# Patient Record
Sex: Male | Born: 2000 | Race: Black or African American | Hispanic: No | Marital: Single | Smoking: Never smoker
Health system: Southern US, Community
[De-identification: ages and names within clinical notes are randomized; demographics above are authoritative.]

## PROBLEM LIST (undated history)

## (undated) ENCOUNTER — Ambulatory Visit (HOSPITAL_COMMUNITY): Admission: EM | Payer: Medicaid Other | Source: Home / Self Care

## (undated) DIAGNOSIS — L409 Psoriasis, unspecified: Secondary | ICD-10-CM

## (undated) HISTORY — PX: WISDOM TOOTH EXTRACTION: SHX21

## (undated) HISTORY — PX: KNEE ARTHROSCOPY W/ ACL RECONSTRUCTION: SHX1858

---
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
## 2018-02-05 ENCOUNTER — Ambulatory Visit: Payer: Self-pay | Admitting: Primary Care

## 2018-12-07 IMAGING — CR DG CHEST 2V
2 series · 2 of 2 positions shown · non-contrast
Comparison: 04/05/2015

CLINICAL DATA: Left-sided chest tightness for 4 days. Recently
finished a course of antibiotics for bronchitis. Continued chest
tightness.

EXAM:
CHEST  2 VIEW

[chest pa]
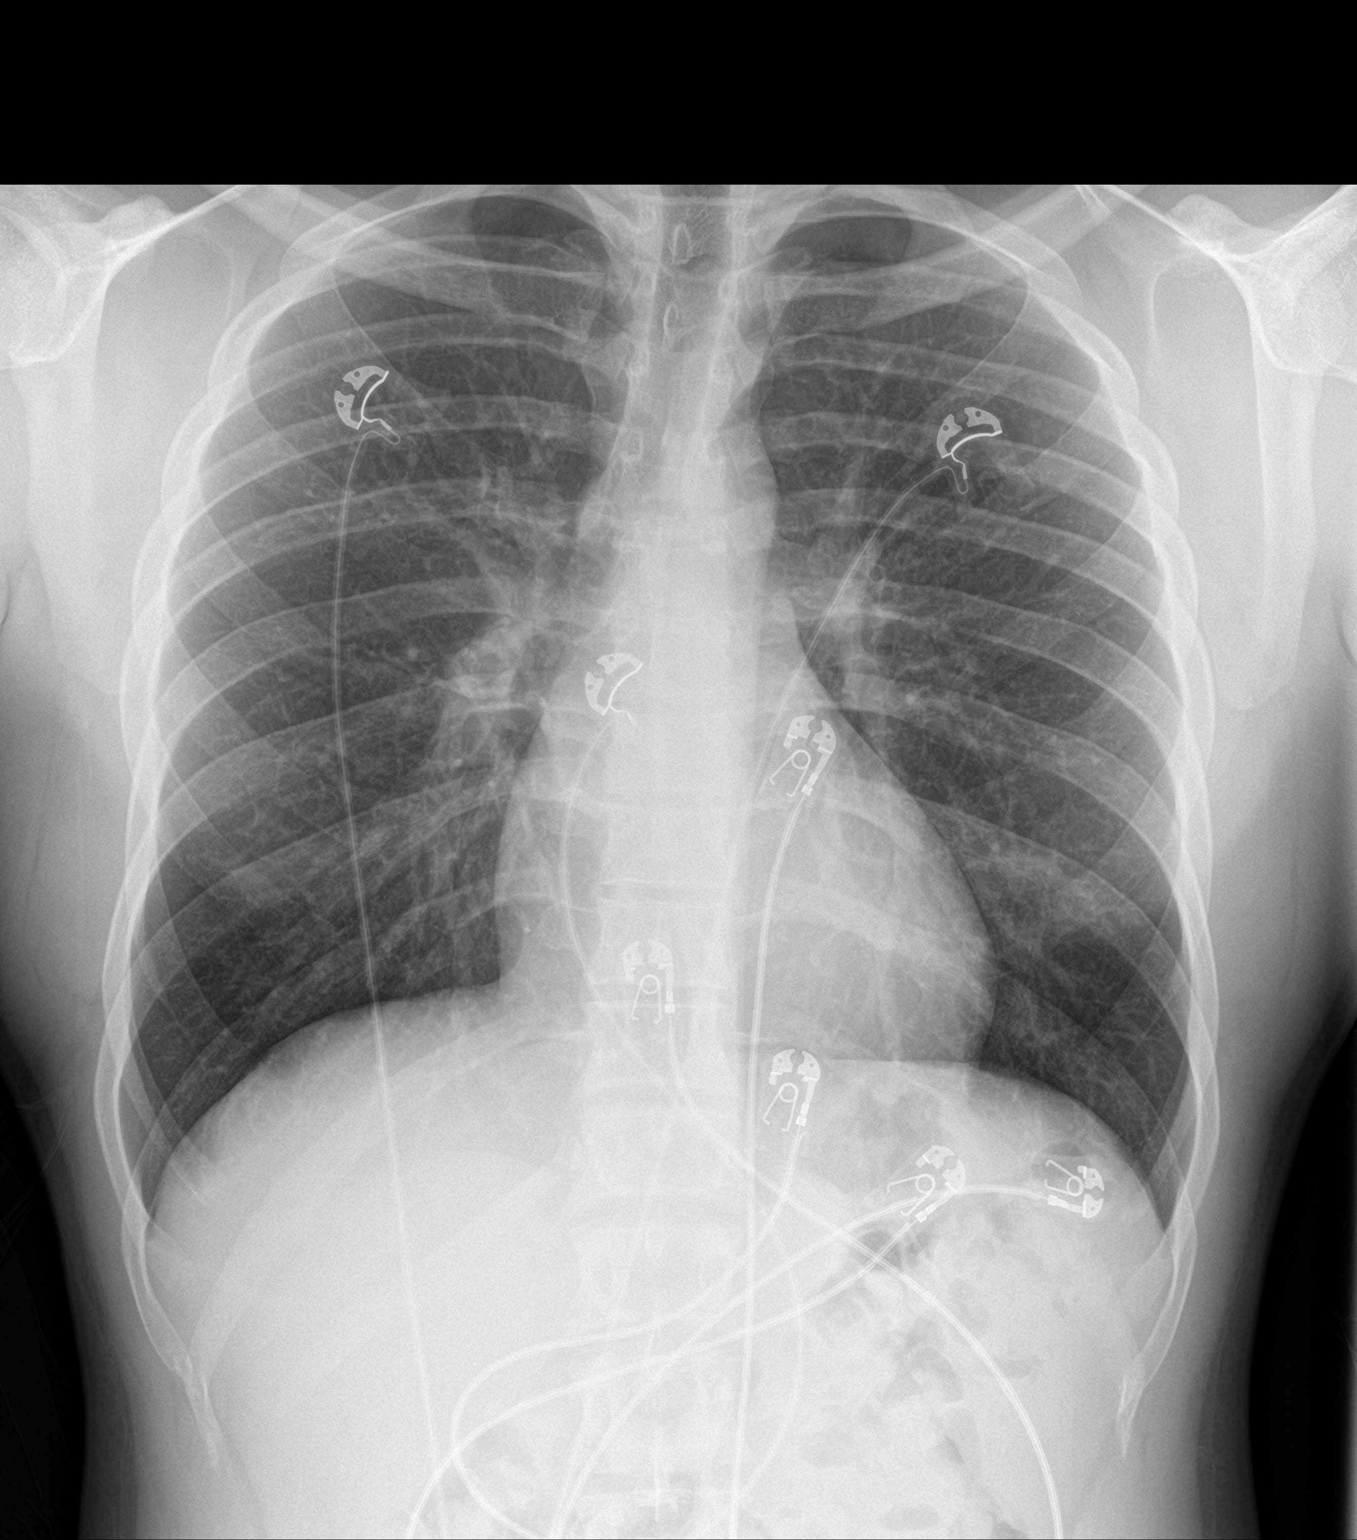

[chest lat]
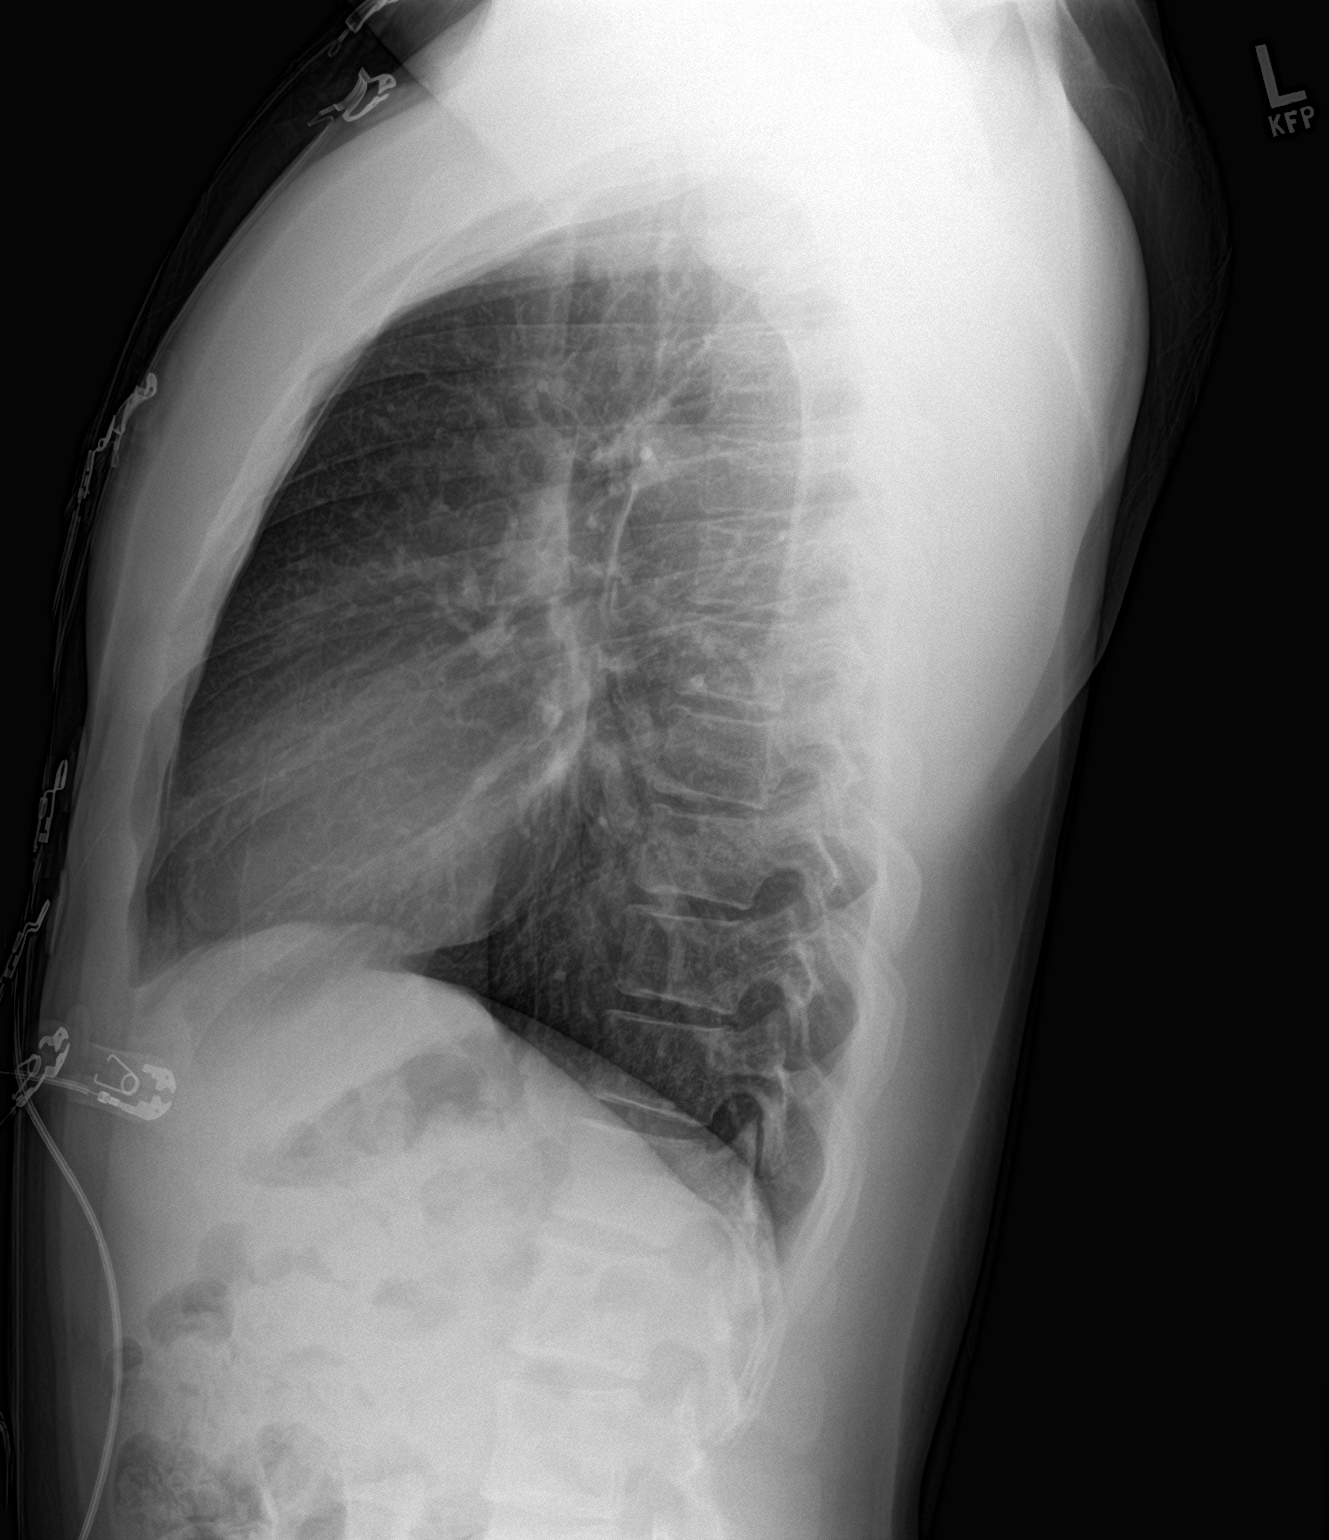

[2 of 2 positions shown; findings below may reference images not displayed]

FINDINGS: Mild hyperinflation. Normal heart size and pulmonary vascularity. No
focal airspace disease or consolidation in the lungs. No blunting of
costophrenic angles. No pneumothorax. Mediastinal contours appear
intact.
IMPRESSION: No active cardiopulmonary disease.

## 2019-06-17 ENCOUNTER — Emergency Department (HOSPITAL_COMMUNITY)
Admission: EM | Admit: 2019-06-17 | Discharge: 2019-06-18 | Disposition: A | Discharge status: Home / Self Care | Payer: Medicaid Other | Attending: Emergency Medicine | Admitting: Emergency Medicine

## 2019-06-17 ENCOUNTER — Encounter (HOSPITAL_COMMUNITY): Payer: Self-pay

## 2019-06-17 DIAGNOSIS — Z79899 Other long term (current) drug therapy: Secondary | ICD-10-CM | POA: Diagnosis not present

## 2019-06-17 DIAGNOSIS — R51 Headache: Secondary | ICD-10-CM | POA: Diagnosis not present

## 2019-06-17 DIAGNOSIS — Y9389 Activity, other specified: Secondary | ICD-10-CM | POA: Diagnosis not present

## 2019-06-17 DIAGNOSIS — R0789 Other chest pain: Secondary | ICD-10-CM | POA: Diagnosis not present

## 2019-06-17 DIAGNOSIS — M79662 Pain in left lower leg: Secondary | ICD-10-CM

## 2019-06-17 DIAGNOSIS — M542 Cervicalgia: Secondary | ICD-10-CM | POA: Diagnosis not present

## 2019-06-17 DIAGNOSIS — M25532 Pain in left wrist: Secondary | ICD-10-CM
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
## 2019-06-18 ENCOUNTER — Emergency Department (HOSPITAL_COMMUNITY): Payer: Medicaid Other

## 2019-06-18 MED ORDER — NAPROXEN 500 MG PO TABS
500.0000 mg | ORAL_TABLET | Freq: Once | ORAL | Status: AC
  Administered 2019-06-18: 500 mg via ORAL
  Filled 2019-06-18: qty 1

## 2019-06-18 MED ORDER — NAPROXEN 500 MG PO TABS: 500.0000 mg | ORAL_TABLET | Freq: Two times a day (BID) | ORAL | 0 refills | Status: DC

## 2019-06-18 NOTE — Discharge Instructions (Signed)
Take naproxen 2 times a day with meals.  Do not take other anti-inflammatories at the same time (Advil, Motrin, ibuprofen, Aleve). You may supplement with Tylenol if you need further pain control. Use ice packs or heating pads if this helps control your pain. Use muscle creams (salonpas, icy hot, bengay, biofreeze) as needed for pain. You will likely have continued muscle stiffness and soreness over the next couple days.  Follow-up with primary care in 1 week if your symptoms are not improving. Return to the emergency room if you develop vision changes, vomiting, slurred speech, numbness, loss of bowel or bladder control, or any new or worsening symptoms.
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
## 2019-07-06 ENCOUNTER — Other Ambulatory Visit: Payer: Self-pay

## 2019-07-06 DIAGNOSIS — Z20822 Contact with and (suspected) exposure to covid-19: Secondary | ICD-10-CM

## 2019-07-07 LAB — NOVEL CORONAVIRUS, NAA: SARS-CoV-2, NAA: NOT DETECTED
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
## 2022-08-01 ENCOUNTER — Ambulatory Visit
Admission: EM | Admit: 2022-08-01 | Discharge: 2022-08-01 | Disposition: A | Discharge status: Home / Self Care | Payer: Medicaid Other | Attending: Urgent Care | Admitting: Urgent Care

## 2022-08-01 DIAGNOSIS — U071 COVID-19: Secondary | ICD-10-CM | POA: Diagnosis not present

## 2022-08-01 DIAGNOSIS — R52 Pain, unspecified: Secondary | ICD-10-CM | POA: Diagnosis not present

## 2022-08-01 MED ORDER — PROMETHAZINE-DM 6.25-15 MG/5ML PO SYRP: 2.5000 mL | ORAL_SOLUTION | Freq: Three times a day (TID) | ORAL | 0 refills | Status: DC | PRN

## 2022-08-01 MED ORDER — BENZONATATE 100 MG PO CAPS: 100.0000 mg | ORAL_CAPSULE | Freq: Three times a day (TID) | ORAL | 0 refills | Status: DC | PRN

## 2022-08-01 NOTE — ED Triage Notes (Signed)
Pt c/o cough, headache, upper back pain, upset stomach, fatigue, onset ~ last night. Reports testing covid(+) at home. Requesting covid test at clinic today. Also requesting work note.

## 2022-08-01 NOTE — Discharge Instructions (Addendum)
We will manage this for COVID infection with supportive care. For sore throat or cough try using a honey-based tea. Use 3 teaspoons of honey with juice squeezed from half lemon. Place shaved pieces of ginger into 1/2-1 cup of water and warm over stove top. Then mix the ingredients and repeat every 4 hours as needed. Please take ibuprofen 600mg  every 6 hours with food alternating with OR taken together with Tylenol 500mg -650mg  every 6 hours for throat pain, fevers, aches and pains. Hydrate very well with at least 2 liters of water. Eat light meals such as soups (chicken and noodles, vegetable, chicken and wild rice).  Do not eat foods that you are allergic to.  Taking an antihistamine like Zyrtec (10mg ) can help against postnasal drainage, sinus congestion which can cause sinus pain, sinus headaches, throat pain, painful swallowing, coughing.  You can take this together with pseudoephedrine (Sudafed) at a dose of 30-60 mg 3 times a day or twice daily as needed for the same kind of nasal drip, congestion.  You can use the cough medications as needed.

## 2022-08-01 NOTE — ED Provider Notes (Signed)
[facility]  Note:  This document was prepared using Dragon voice recognition software and may include unintentional dictation errors.  MRN: [id number] DOB: 2001-05-03  Subjective:   Calvin Fuller is a 21 y.o. male presenting for 2-day history of acute onset persistent body aches, coughing, sinus headaches, upset stomach, fatigue, malaise.  Symptoms were worse today.  Did a COVID test and was positive.  Needs a confirmation test with [place] and a note for work.  No overt chest pain, shortness of breath or wheezing. Denies smoking cigarettes or drinking alcohol.   He is not currently taking any medications and has no known food or drug allergies.  Denies past medical and surgical history.   History reviewed. No pertinent family history.  Social History   Tobacco Use   Smoking status: Never   Smokeless tobacco: Never  Substance Use Topics   Alcohol use: No   Drug use: No    ROS   Objective:   Vitals: BP 130/88 (BP Location: Left Arm)   Pulse 70   Temp 98 F (36.7 C) (Oral)   Resp 18   SpO2 98%   Physical Exam Constitutional:      General: He is not in acute distress.    Appearance: Normal appearance. He is well-developed. He is not ill-appearing, toxic-appearing or diaphoretic.  HENT:     Head: Normocephalic and atraumatic.     Right Ear: External ear normal.     Left Ear: External ear normal.     Nose: Nose normal.     Mouth/Throat:     Mouth: Mucous membranes are moist.  Eyes:     General: No scleral icterus.       Right eye: No discharge.        Left eye: No discharge.     Extraocular Movements: Extraocular movements intact.  Cardiovascular:     Rate and Rhythm: Normal rate and regular rhythm.     Heart sounds: Normal heart sounds. No murmur heard.    No friction rub. No gallop.  Pulmonary:     Effort: Pulmonary effort is normal. No respiratory distress.     Breath sounds: Normal breath sounds. No stridor. No wheezing, rhonchi or rales.   Neurological:     Mental Status: He is alert and oriented to person, place, and time.  Psychiatric:        Mood and Affect: Mood normal.        Behavior: Behavior normal.        Thought Content: Thought content normal.     Assessment and Plan :   PDMP not reviewed this encounter.  1. Clinical diagnosis of COVID-19   2. Body aches    Confirmation COVID test pending.  Recommend supportive care. Deferred imaging given clear cardiopulmonary exam, hemodynamically stable vital signs.  Patient is otherwise in really good health and does not meet criteria for COVID antivirals.  I discussed this with him and he prefers to avoid it for now. Counseled patient on potential for adverse effects with medications prescribed/recommended today, ER and return-to-clinic precautions discussed, patient verbalized understanding.    Wallis Bamberg, [place] 08/01/22 1709

## 2022-08-03 LAB — SARS CORONAVIRUS 2 (TAT 6-24 HRS): SARS Coronavirus 2: POSITIVE — AB

## 2022-08-04 ENCOUNTER — Telehealth (HOSPITAL_COMMUNITY): Payer: Self-pay | Admitting: Emergency Medicine

## 2022-08-04 NOTE — Telephone Encounter (Signed)
PAtient returned call about his results, everything reviewed, all questions answered

## 2022-09-06 ENCOUNTER — Ambulatory Visit
Admission: EM | Admit: 2022-09-06 | Discharge: 2022-09-06 | Disposition: A | Discharge status: Home / Self Care | Payer: Medicaid Other | Attending: Physician Assistant | Admitting: Physician Assistant

## 2022-09-06 DIAGNOSIS — S29011A Strain of muscle and tendon of front wall of thorax, initial encounter: Secondary | ICD-10-CM | POA: Diagnosis not present

## 2022-09-06 MED ORDER — PREDNISONE 20 MG PO TABS: 40.0000 mg | ORAL_TABLET | Freq: Every day | ORAL | 0 refills | Status: AC

## 2022-09-06 MED ORDER — CYCLOBENZAPRINE HCL 10 MG PO TABS: 10.0000 mg | ORAL_TABLET | Freq: Two times a day (BID) | ORAL | 0 refills | Status: DC | PRN

## 2022-09-06 NOTE — ED Triage Notes (Signed)
Pt presents with bilateral shoulder and neck pain that radiates into his upper chest area some; pt states yesterday he was stretching & started feeling pain.
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
## 2022-09-14 ENCOUNTER — Ambulatory Visit
Admission: EM | Admit: 2022-09-14 | Discharge: 2022-09-14 | Disposition: A | Discharge status: Home / Self Care | Payer: Medicaid Other | Attending: Physician Assistant | Admitting: Physician Assistant

## 2022-09-14 DIAGNOSIS — T148XXA Other injury of unspecified body region, initial encounter: Secondary | ICD-10-CM | POA: Diagnosis not present

## 2022-09-14 MED ORDER — TIZANIDINE HCL 4 MG PO TABS: 4.0000 mg | ORAL_TABLET | Freq: Four times a day (QID) | ORAL | 0 refills | Status: DC | PRN

## 2022-09-14 MED ORDER — IBUPROFEN 600 MG PO TABS: 600.0000 mg | ORAL_TABLET | Freq: Four times a day (QID) | ORAL | 0 refills | Status: DC | PRN

## 2022-09-14 NOTE — ED Triage Notes (Signed)
Pt c/o neck soreness, chest tightness on the left side. Reports being seen at this UC last week. Was given tx that helped but did not offer resolution.
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
## 2022-11-24 ENCOUNTER — Encounter: Payer: Self-pay | Admitting: Emergency Medicine

## 2022-11-24 ENCOUNTER — Ambulatory Visit
Admission: EM | Admit: 2022-11-24 | Discharge: 2022-11-24 | Disposition: A | Discharge status: Home / Self Care | Payer: Medicaid Other | Attending: Family Medicine | Admitting: Family Medicine

## 2022-11-24 ENCOUNTER — Other Ambulatory Visit: Payer: Self-pay

## 2022-11-24 DIAGNOSIS — N342 Other urethritis: Secondary | ICD-10-CM | POA: Insufficient documentation

## 2022-11-24 DIAGNOSIS — J029 Acute pharyngitis, unspecified: Secondary | ICD-10-CM | POA: Insufficient documentation

## 2022-11-24 NOTE — ED Provider Notes (Signed)
[name]    CSN: [id number] Arrival date & time: 11/24/22  1428      History   Chief Complaint Chief Complaint  Patient presents with   Penile Discharge   Nasal Congestion    HPI Calvin Fuller is a 21 y.o. male.    Penile Discharge   Here for penile discharge is been going on for about 2 weeks.  About 1 week ago he began having nasal discharge and cough and a little sore throat.  He has not had any fever or abdominal pain or vomiting. The congestion in his nose and cough are improving.  He has had HIV testing by his report about 2 months ago.  He does express some concern that he may have an STD in his throat.     History reviewed. No pertinent past medical history.  Patient Active Problem List   Diagnosis Date Noted   Mild concussion 08/14/2013    History reviewed. No pertinent surgical history.     Home Medications    Prior to Admission medications   Medication Sig Start Date End Date Taking? Authorizing Provider  loratadine (CLARITIN) 10 MG tablet Take 10 mg by mouth at bedtime.    [provider]    Family History Family History  Family history unknown: Yes    Social History Social History   Tobacco Use   Smoking status: Never   Smokeless tobacco: Never  Substance Use Topics   Alcohol use: No   Drug use: No     Allergies   Patient has no known allergies.   Review of Systems Review of Systems  Genitourinary:  Positive for penile discharge.     Physical Exam Triage Vital Signs ED Triage Vitals [11/24/22 1617]  Enc Vitals Group     BP 129/77     Pulse Rate 60     Resp 18     Temp 97.6 F (36.4 C)     Temp Source Oral     SpO2 97 %     Weight      Height      Head Circumference      Peak Flow      Pain Score 0     Pain Loc      Pain Edu?      Excl. in [place]?    No data found.  Updated Vital Signs BP 129/77 (BP Location: Right Arm)   Pulse 60   Temp 97.6 F (36.4 C) (Oral)   Resp 18    SpO2 97%   Visual Acuity Right Eye Distance:   Left Eye Distance:   Bilateral Distance:    Right Eye Near:   Left Eye Near:    Bilateral Near:     Physical Exam Vitals reviewed.  Constitutional:      General: He is not in acute distress.    Appearance: He is not toxic-appearing.  HENT:     Right Ear: Tympanic membrane and ear canal normal.     Left Ear: Tympanic membrane and ear canal normal.     Nose: Nose normal.     Mouth/Throat:     Mouth: Mucous membranes are moist.     Comments: There is some mild erythema of the tonsils and they are hypertrophied 2+. Eyes:     Extraocular Movements: Extraocular movements intact.     Conjunctiva/sclera: Conjunctivae normal.     Pupils: Pupils are equal, round, and reactive to light.  Cardiovascular:  Rate and Rhythm: Normal rate and regular rhythm.     Heart sounds: No murmur heard. Pulmonary:     Effort: Pulmonary effort is normal.     Breath sounds: Normal breath sounds.  Musculoskeletal:     Cervical back: Neck supple.  Lymphadenopathy:     Cervical: No cervical adenopathy.  Skin:    Capillary Refill: Capillary refill takes less than 2 seconds.     Coloration: Skin is not jaundiced or pale.  Neurological:     General: No focal deficit present.     Mental Status: He is alert and oriented to person, place, and time.  Psychiatric:        Behavior: Behavior normal.      UC Treatments / Results  Labs (all labs ordered are listed, but only abnormal results are displayed) Labs Reviewed  CYTOLOGY, (ORAL, ANAL, URETHRAL) ANCILLARY ONLY  CYTOLOGY, (ORAL, ANAL, URETHRAL) ANCILLARY ONLY    EKG   Radiology No results found.  Procedures Procedures (including critical care time)  Medications Ordered in UC Medications - No data to display  Initial Impression / Assessment and Plan / UC Course  I have reviewed the triage vital signs and the nursing notes.  Pertinent labs & imaging results that were available during  my care of the patient were reviewed by me and considered in my medical decision making (see chart for details).        I did a swab of his oropharynx and he did a self swab of his urethra for STD testing.  We will notify him and treat per protocol any positives Final Clinical Impressions(s) / UC Diagnoses   Final diagnoses:  Acute pharyngitis, unspecified etiology  Urethritis     Discharge Instructions      Staff will notify you if there is anything positive on either swab done today.       ED Prescriptions   None    PDMP not reviewed this encounter.   Zenia Resides, MD 11/24/22 [phone]

## 2022-11-24 NOTE — ED Triage Notes (Signed)
Pt here for penile discharge and cough with nasal congestion x 1 week

## 2022-11-24 NOTE — Discharge Instructions (Signed)
Staff will notify you if there is anything positive on either swab done today.

## 2022-11-29 LAB — CYTOLOGY, (ORAL, ANAL, URETHRAL) ANCILLARY ONLY
Chlamydia: POSITIVE — AB
Comment: NEGATIVE
Comment: NEGATIVE
Comment: NORMAL
Neisseria Gonorrhea: NEGATIVE
Trichomonas: NEGATIVE

## 2022-11-30 ENCOUNTER — Telehealth (HOSPITAL_COMMUNITY): Payer: Self-pay | Admitting: Emergency Medicine

## 2022-11-30 LAB — CYTOLOGY, (ORAL, ANAL, URETHRAL) ANCILLARY ONLY
Chlamydia: NEGATIVE
Comment: NEGATIVE
Comment: NEGATIVE
Comment: NORMAL
Neisseria Gonorrhea: NEGATIVE
Trichomonas: NEGATIVE

## 2022-11-30 MED ORDER — DOXYCYCLINE HYCLATE 100 MG PO CAPS: 100.0000 mg | ORAL_CAPSULE | Freq: Two times a day (BID) | ORAL | 0 refills | Status: AC

## 2022-12-29 ENCOUNTER — Ambulatory Visit
Admission: EM | Admit: 2022-12-29 | Discharge: 2022-12-29 | Disposition: A | Discharge status: Home / Self Care | Payer: Medicaid Other | Attending: Physician Assistant | Admitting: Physician Assistant

## 2022-12-29 DIAGNOSIS — B019 Varicella without complication: Secondary | ICD-10-CM

## 2022-12-29 DIAGNOSIS — L219 Seborrheic dermatitis, unspecified: Secondary | ICD-10-CM

## 2022-12-29 DIAGNOSIS — L299 Pruritus, unspecified: Secondary | ICD-10-CM

## 2022-12-29 MED ORDER — HYDROXYZINE HCL 25 MG PO TABS: 25.0000 mg | ORAL_TABLET | Freq: Four times a day (QID) | ORAL | 0 refills | Status: DC

## 2022-12-29 MED ORDER — KETOCONAZOLE 2 % EX SHAM: 1.0000 | MEDICATED_SHAMPOO | CUTANEOUS | 0 refills | Status: DC

## 2022-12-29 MED ORDER — VALACYCLOVIR HCL 1 G PO TABS: 1000.0000 mg | ORAL_TABLET | Freq: Three times a day (TID) | ORAL | 0 refills | Status: DC

## 2022-12-29 MED ORDER — LORATADINE 10 MG PO TABS: 10.0000 mg | ORAL_TABLET | Freq: Every day | ORAL | 0 refills | Status: DC

## 2022-12-29 NOTE — Discharge Instructions (Addendum)
Advised take Claritin 10 mg once daily to help decrease itching. Advised take the hydroxyzine 25 mg every 8 hours to help reduce itching.  (Be cautious with this medicine as it does cause drowsiness) Advised to take Valtrex every 8 hours until completed. Advised to use the ketoconazole shampoo 2 times a week to help scalp clear.  Internal referral has been made to dermatology office [place], they should be calling you within the next 48 hours to arrange an appointment to be seen.  Advised to follow-up PCP or return to urgent care if symptoms fail to improve.

## 2022-12-29 NOTE — ED Triage Notes (Signed)
Pt states rash to his face and torso for over a week. States he has been using an OTC cream for it.

## 2022-12-29 NOTE — ED Provider Notes (Signed)
[name]    CSN: [id number] Arrival date & time: 12/29/22  1354      History   Chief Complaint Chief Complaint  Patient presents with   Rash    HPI Calvin Fuller is a 22 y.o. male.   22 year old male presents with rash on face and trunk.  Patient indicates for the past week he has had a rash that has developed on the scalp, face, upper extremities front and backside.  He indicates that the rash is itchy.  He indicates that his rash has areas to where it is pustular, some areas are crusted over, some areas are healing and others are forming new pustules.  Patient indicates he has a similar reaction on the face however these are more raised red areas without any crusting.  Patient indicates he has also scalp seborrheic dermatitis that has been present intermittently for the past several months.  Patient indicates that the abdomen and lower extremities are clear.  He has not have any fever, cough, congestion, nausea or vomiting.  He denies any wheezing or shortness of breath.  Patient indicates he has not been eating new foods, fluids, and is not taking any medications, has not been exposed to any chemicals, and has not traveled out of the state over the past couple weeks.   Rash   History reviewed. No pertinent past medical history.  Patient Active Problem List   Diagnosis Date Noted   Mild concussion 08/14/2013    Past Surgical History:  Procedure Laterality Date   KNEE ARTHROSCOPY W/ ACL RECONSTRUCTION Left        Home Medications    Prior to Admission medications   Medication Sig Start Date End Date Taking? Authorizing Provider  hydrOXYzine (ATARAX) 25 MG tablet Take 1 tablet (25 mg total) by mouth every 6 (six) hours. 12/29/22  Yes Nyoka Lint, PA-C  ketoconazole (NIZORAL) 2 % shampoo Apply 1 Application topically 2 (two) times a week. 12/29/22  Yes Nyoka Lint, PA-C  valACYclovir (VALTREX) 1000 MG tablet Take 1 tablet (1,000 mg total) by mouth 3  (three) times daily. 12/29/22  Yes Nyoka Lint, PA-C  loratadine (CLARITIN) 10 MG tablet Take 1 tablet (10 mg total) by mouth at bedtime. 12/29/22   Nyoka Lint, PA-C    Family History Family History  Family history unknown: Yes    Social History Social History   Tobacco Use   Smoking status: Never   Smokeless tobacco: Never  Substance Use Topics   Alcohol use: No   Drug use: No     Allergies   Patient has no known allergies.   Review of Systems Review of Systems  Skin:  Positive for rash (face, and chest/back).     Physical Exam Triage Vital Signs ED Triage Vitals [12/29/22 1423]  Enc Vitals Group     BP 120/74     Pulse Rate 80     Resp 16     Temp 97.8 F (36.6 C)     Temp Source Oral     SpO2 97 %     Weight      Height      Head Circumference      Peak Flow      Pain Score 0     Pain Loc      Pain Edu?      Excl. in [place]?    No data found.  Updated Vital Signs BP 120/74 (BP Location: Left Arm)   Pulse 80  Temp 97.8 F (36.6 C) (Oral)   Resp 16   SpO2 97%   Visual Acuity Right Eye Distance:   Left Eye Distance:   Bilateral Distance:    Right Eye Near:   Left Eye Near:    Bilateral Near:           Physical Exam Constitutional:      Appearance: Normal appearance.  HENT:     Right Ear: Tympanic membrane and ear canal normal.     Left Ear: Tympanic membrane and ear canal normal.     Mouth/Throat:     Mouth: Mucous membranes are moist.     Pharynx: Oropharynx is clear.  Skin:         Comments: Skin: Face: There are multiple areas that are gathered papular eruptions across the forehead and bilateral cheek areas, these are not crusting or papular, no drainage. Chest/back: There is a diffuse pustular and papular eruption that has lesions of different sizes and in different stages of development some have crusted over, new ones are forming, some are pustular without drainage.  There is minimal redness surrounding the areas. The abdomen  and lower extremities are clear.  Neurological:     Mental Status: He is alert.      UC Treatments / Results  Labs (all labs ordered are listed, but only abnormal results are displayed) Labs Reviewed - No data to display  EKG   Radiology No results found.  Procedures Procedures (including critical care time)  Medications Ordered in UC Medications - No data to display  Initial Impression / Assessment and Plan / UC Course  I have reviewed the triage vital signs and the nursing notes.  Pertinent labs & imaging results that were available during my care of the patient were reviewed by me and considered in my medical decision making (see chart for details).    Plan: The diagnosis will be treated with the following: 1.  Itching: A.  Claritin 10 mg once daily to help decrease itching. B.  Atarax 25 mg every 8 hours to help decrease itching. 2.  Seborrheic dermatitis of scalp: A.  Ketoconazole shampoo twice a week to help reduce rash and skin eruption. 3.  Varicella: A.  Valacyclovir 1000 mg 3 times a day until completed. 4.  Internal referral to dermatology for evaluation as this may be a mixed picture with several different skin eruptions occurring. 5.  Advised to follow-up PCP or return to urgent care as needed. Final Clinical Impressions(s) / UC Diagnoses   Final diagnoses:  Itching  Seborrheic dermatitis of scalp  Varicella without complication     Discharge Instructions      Advised take Claritin 10 mg once daily to help decrease itching. Advised take the hydroxyzine 25 mg every 8 hours to help reduce itching.  (Be cautious with this medicine as it does cause drowsiness) Advised to take Valtrex every 8 hours until completed. Advised to use the ketoconazole shampoo 2 times a week to help scalp clear.  Internal referral has been made to dermatology office [place], they should be calling you within the next 48 hours to arrange an appointment to be  seen.  Advised to follow-up PCP or return to urgent care if symptoms fail to improve.    ED Prescriptions     Medication Sig Dispense Auth. Provider   loratadine (CLARITIN) 10 MG tablet Take 1 tablet (10 mg total) by mouth at bedtime. 10 tablet Nyoka Lint, PA-C   hydrOXYzine (ATARAX) 25  MG tablet Take 1 tablet (25 mg total) by mouth every 6 (six) hours. 12 tablet Nyoka Lint, PA-C   valACYclovir (VALTREX) 1000 MG tablet Take 1 tablet (1,000 mg total) by mouth 3 (three) times daily. 21 tablet Nyoka Lint, PA-C   ketoconazole (NIZORAL) 2 % shampoo Apply 1 Application topically 2 (two) times a week. 120 mL Nyoka Lint, PA-C      PDMP not reviewed this encounter.   Nyoka Lint, PA-C 12/29/22 1510

## 2023-01-05 ENCOUNTER — Ambulatory Visit: Payer: Medicaid Other | Admitting: Dermatology

## 2023-01-16 ENCOUNTER — Ambulatory Visit (INDEPENDENT_AMBULATORY_CARE_PROVIDER_SITE_OTHER): Payer: Medicaid Other | Admitting: Dermatology

## 2023-01-16 VITALS — BP 117/76 | HR 76

## 2023-01-16 DIAGNOSIS — L409 Psoriasis, unspecified: Secondary | ICD-10-CM | POA: Diagnosis not present

## 2023-01-16 DIAGNOSIS — R21 Rash and other nonspecific skin eruption: Secondary | ICD-10-CM

## 2023-01-16 DIAGNOSIS — Z7189 Other specified counseling: Secondary | ICD-10-CM

## 2023-01-16 DIAGNOSIS — Z79899 Other long term (current) drug therapy: Secondary | ICD-10-CM

## 2023-01-16 MED ORDER — MOMETASONE FUROATE 0.1 % EX CREA: 1.0000 | TOPICAL_CREAM | Freq: Every day | CUTANEOUS | 0 refills | Status: DC | PRN

## 2023-01-16 MED ORDER — TRIAMCINOLONE ACETONIDE 0.1 % EX CREA: TOPICAL_CREAM | CUTANEOUS | 0 refills | Status: DC

## 2023-01-16 NOTE — Patient Instructions (Signed)
Due to recent changes in healthcare laws, you may see results of your pathology and/or laboratory studies on MyChart before the doctors have had a chance to review them. We understand that in some cases there may be results that are confusing or concerning to you. Please understand that not all results are received at the same time and often the doctors may need to interpret multiple results in order to provide you with the best plan of care or course of treatment. Therefore, we ask that you please give us 2 business days to thoroughly review all your results before contacting the office for clarification. Should we see a critical lab result, you will be contacted sooner.   If You Need Anything After Your Visit  If you have any questions or concerns for your doctor, please call our main line at [phone] and press option 4 to reach your doctor's medical assistant. If no one answers, please leave a voicemail as directed and we will return your call as soon as possible. Messages left after 4 pm will be answered the following business day.   You may also send us a message via MyChart. We typically respond to MyChart messages within 1-2 business days.  For prescription refills, please ask your pharmacy to contact our office. Our fax number is [phone].  If you have an urgent issue when the clinic is closed that cannot wait until the next business day, you can page your doctor at the number below.    Please note that while we do our best to be available for urgent issues outside of office hours, we are not available 24/7.   If you have an urgent issue and are unable to reach us, you may choose to seek medical care at your doctor's office, retail clinic, urgent care center, or emergency room.  If you have a medical emergency, please immediately call 911 or go to the emergency department.  Pager Numbers  - Dr. Kowalski: [phone]  - Dr. Moye: [phone]  - Dr. Stewart:  [phone]  In the event of inclement weather, please call our main line at [phone] for an update on the status of any delays or closures.  Dermatology Medication Tips: Please keep the boxes that topical medications come in in order to help keep track of the instructions about where and how to use these. Pharmacies typically print the medication instructions only on the boxes and not directly on the medication tubes.   If your medication is too expensive, please contact our office at [phone] option 4 or send us a message through MyChart.   We are unable to tell what your co-pay for medications will be in advance as this is different depending on your insurance coverage. However, we may be able to find a substitute medication at lower cost or fill out paperwork to get insurance to cover a needed medication.   If a prior authorization is required to get your medication covered by your insurance company, please allow us 1-2 business days to complete this process.  Drug prices often vary depending on where the prescription is filled and some pharmacies may offer cheaper prices.  The website www.goodrx.com contains coupons for medications through different pharmacies. The prices here do not account for what the cost may be with help from insurance (it may be cheaper with your insurance), but the website can give you the price if you did not use any insurance.  - You can print the associated coupon and take it with   your prescription to the pharmacy.  - You may also stop by our office during regular business hours and pick up a GoodRx coupon card.  - If you need your prescription sent electronically to a different pharmacy, notify our office through [facility] MyChart or by phone at [phone] option 4.     Si Usted Necesita Algo Despus de Su Visita  Tambin puede enviarnos un mensaje a travs de MyChart. Por lo general respondemos a los mensajes de MyChart en el transcurso de 1 a 2  das hbiles.  Para renovar recetas, por favor pida a su farmacia que se ponga en contacto con nuestra oficina. Nuestro nmero de fax es el [phone].  Si tiene un asunto urgente cuando la clnica est cerrada y que no puede esperar hasta el siguiente da hbil, puede llamar/localizar a su doctor(a) al nmero que aparece a continuacin.   Por favor, tenga en cuenta que aunque hacemos todo lo posible para estar disponibles para asuntos urgentes fuera del horario de oficina, no estamos disponibles las 24 horas del da, los 7 das de la semana.   Si tiene un problema urgente y no puede comunicarse con nosotros, puede optar por buscar atencin mdica  en el consultorio de su doctor(a), en una clnica privada, en un centro de atencin urgente o en una sala de emergencias.  Si tiene una emergencia mdica, por favor llame inmediatamente al 911 o vaya a la sala de emergencias.  Nmeros de bper  - Dr. Kowalski: [phone]  - Dra. Moye: [phone]  - Dra. Stewart: [phone]  En caso de inclemencias del tiempo, por favor llame a nuestra lnea principal al [phone] para una actualizacin sobre el estado de cualquier retraso o cierre.  Consejos para la medicacin en dermatologa: Por favor, guarde las cajas en las que vienen los medicamentos de uso tpico para ayudarle a seguir las instrucciones sobre dnde y cmo usarlos. Las farmacias generalmente imprimen las instrucciones del medicamento slo en las cajas y no directamente en los tubos del medicamento.   Si su medicamento es muy caro, por favor, pngase en contacto con nuestra oficina llamando al [phone] y presione la opcin 4 o envenos un mensaje a travs de MyChart.   No podemos decirle cul ser su copago por los medicamentos por adelantado ya que esto es diferente dependiendo de la cobertura de su seguro. Sin embargo, es posible que podamos encontrar un medicamento sustituto a menor costo o llenar un formulario para que el  seguro cubra el medicamento que se considera necesario.   Si se requiere una autorizacin previa para que su compaa de seguros cubra su medicamento, por favor permtanos de 1 a 2 das hbiles para completar este proceso.  Los precios de los medicamentos varan con frecuencia dependiendo del lugar de dnde se surte la receta y alguna farmacias pueden ofrecer precios ms baratos.  El sitio web www.goodrx.com tiene cupones para medicamentos de diferentes farmacias. Los precios aqu no tienen en cuenta lo que podra costar con la ayuda del seguro (puede ser ms barato con su seguro), pero el sitio web puede darle el precio si no utiliz ningn seguro.  - Puede imprimir el cupn correspondiente y llevarlo con su receta a la farmacia.  - Tambin puede pasar por nuestra oficina durante el horario de atencin regular y recoger una tarjeta de cupones de GoodRx.  - Si necesita que su receta se enve electrnicamente a una farmacia diferente, informe a nuestra oficina a travs de MyChart de [place]   o por telfono llamando al [phone] y presione la opcin 4.  

## 2023-01-16 NOTE — Progress Notes (Unsigned)
   New Patient Visit  Subjective  Calvin Fuller is a 22 y.o. male who presents for the following: Rash (Started one month ago, had a similar rash months ago - hx of psoriasis and seborrheic dermatitis was seen in ED and rx'ed Ketoconazole 2% shampoo, loratadine, and Hydroxyzine 25 mg po QHS for itch. He has a fhx of psoriasis in his grandfather. He has no pets in the home, has started no new medications, and hasn't been ill recently).   The following portions of the chart were reviewed this encounter and updated as appropriate:       Review of Systems:  No other skin or systemic complaints except as noted in HPI or Assessment and Plan.  Objective  Well appearing patient in no apparent distress; mood and affect are within normal limits.  A focused examination was performed including the face, trunk, extremities. Relevant physical exam findings are noted in the Assessment and Plan.  Face, trunk, extremities           Assessment & Plan  Psoriasis Face, trunk, extremities  No sore throat, or sore/ulcers on the body, + fhx of psoriasis in grandfather.   Counseling on psoriasis and coordination of care  psoriasis is a chronic non-curable, but treatable genetic/hereditary disease that may have other systemic features affecting other organ systems such as joints (Psoriatic Arthritis). It is associated with an increased risk of inflammatory bowel disease, heart disease, non-alcoholic fatty liver disease, and depression.  Treatments include light and laser treatments; topical medications; and systemic medications including oral and injectables.  Start Mometasone to aa's forehead, scalp, and face QD - BID. Start TMC 0.1% cream to aa's trunk QD-BID 5d/wk PRN 454g 0RF. Topical steroids (such as triamcinolone, fluocinolone, fluocinonide, mometasone, clobetasol, halobetasol, betamethasone, hydrocortisone) can cause thinning and lightening of the skin if they are used for too long in the  same area. Your physician has selected the right strength medicine for your problem and area affected on the body. Please use your medication only as directed by your physician to prevent side effects.   Consider Jamal Maes, or Zoryve in the future.    RPR - Face, trunk, extremities  mometasone (ELOCON) 0.1 % cream - Face, trunk, extremities Apply 1 Application topically daily as needed (Rash). Apply to aa's rash on the forehead and scalp QD-BID PRN up to 5d/wk.  triamcinolone cream (KENALOG) 0.1 % - Face, trunk, extremities Apply to aa's rash on the body and arms QD-BID PRN up to 5d/wk. Avoid applying to face, groin, and axilla. Use as directed. Long-term use can cause thinning of the skin.   Return in about 6 weeks (around 02/27/2023) for rash follow up .  Luther Redo, CMA, am acting as scribe for Sarina Ser, MD .

## 2023-01-17 ENCOUNTER — Encounter: Payer: Self-pay | Admitting: Dermatology

## 2023-02-04 ENCOUNTER — Ambulatory Visit
Admission: EM | Admit: 2023-02-04 | Discharge: 2023-02-04 | Disposition: A | Discharge status: Home / Self Care | Payer: Medicaid Other | Attending: Internal Medicine | Admitting: Internal Medicine

## 2023-02-04 DIAGNOSIS — L409 Psoriasis, unspecified: Secondary | ICD-10-CM | POA: Diagnosis present

## 2023-02-04 DIAGNOSIS — R369 Urethral discharge, unspecified: Secondary | ICD-10-CM | POA: Diagnosis present

## 2023-02-04 DIAGNOSIS — Z113 Encounter for screening for infections with a predominantly sexual mode of transmission: Secondary | ICD-10-CM | POA: Diagnosis present

## 2023-02-04 NOTE — ED Triage Notes (Addendum)
Patient presents to [facility] for clear penile discharge, pressure since Sunday. Unprotected sexual intercourse on Saturday. States he has had three different partners. Requesting blood work as well.  Also req a refill for Nizoral shampoo. States he has psoriasis.

## 2023-02-04 NOTE — ED Provider Notes (Signed)
[name]    CSN: [id number] Arrival date & time: 02/04/23  1209      History   Chief Complaint Chief Complaint  Patient presents with   SEXUALLY TRANSMITTED DISEASE    HPI Calvin Fuller is a 22 y.o. male.   Patient presents with 2 different chief complaints today.  Patient reports clear penile discharge and penile discomfort that has been present for a few days.  Patient denies any confirmed exposure to STD but has had multiple sexual partners where he has had unprotected sexual intercourse recently.  Patient denies dysuria, urinary frequency, testicular pain, abdominal pain, back pain, fever.  Also requesting refill on ketoconazole shampoo.  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  He was prescribed topical ointment by dermatology which he has been using with improvement in scalp symptoms.  Reports the rash sometimes spreads into his face.  He was also prescribed ketoconazole shampoo by urgent care visit prior to dermatology evaluation and wishes to have this refilled as he thinks it is helpful.     History reviewed. No pertinent past medical history.  Patient Active Problem List   Diagnosis Date Noted   Mild concussion 08/14/2013    Past Surgical History:  Procedure Laterality Date   KNEE ARTHROSCOPY W/ ACL RECONSTRUCTION Left        Home Medications    Prior to Admission medications   Medication Sig Start Date End Date Taking? Authorizing Provider  hydrOXYzine (ATARAX) 25 MG tablet Take 1 tablet (25 mg total) by mouth every 6 (six) hours. Patient not taking: Reported on 01/16/2023 12/29/22   Nyoka Lint, PA-C  ketoconazole (NIZORAL) 2 % shampoo Apply 1 Application topically 2 (two) times a week. 12/29/22   Nyoka Lint, PA-C  loratadine (CLARITIN) 10 MG tablet Take 1 tablet (10 mg total) by mouth at bedtime. Patient not taking: Reported on 01/16/2023 12/29/22   Nyoka Lint, PA-C  mometasone (ELOCON) 0.1  % cream Apply 1 Application topically daily as needed (Rash). Apply to aa's rash on the forehead and scalp QD-BID PRN up to 5d/wk. 01/16/23   Ralene Bathe, MD  triamcinolone cream (KENALOG) 0.1 % Apply to aa's rash on the body and arms QD-BID PRN up to 5d/wk. Avoid applying to face, groin, and axilla. Use as directed. Long-term use can cause thinning of the skin. 01/16/23   Ralene Bathe, MD  valACYclovir (VALTREX) 1000 MG tablet Take 1 tablet (1,000 mg total) by mouth 3 (three) times daily. Patient not taking: Reported on 01/16/2023 12/29/22   Nyoka Lint, PA-C    Family History Family History  Family history unknown: Yes    Social History Social History   Tobacco Use   Smoking status: Never   Smokeless tobacco: Never  Substance Use Topics   Alcohol use: No   Drug use: No     Allergies   Patient has no known allergies.   Review of Systems Review of Systems Per HPI  Physical Exam Triage Vital Signs ED Triage Vitals  Enc Vitals Group     BP 02/04/23 1330 129/83     Pulse Rate 02/04/23 1330 64     Resp 02/04/23 1330 20     Temp 02/04/23 1330 (!) 97.5 F (36.4 C)     Temp Source 02/04/23 1330 Oral     SpO2 02/04/23 1330 97 %     Weight --      Height --  Head Circumference --      Peak Flow --      Pain Score 02/04/23 1338 0     Pain Loc --      Pain Edu? --      Excl. in [place]? --    No data found.  Updated Vital Signs BP 129/83 (BP Location: Left Arm)   Pulse 64   Temp (!) 97.5 F (36.4 C) (Oral)   Resp 20   SpO2 97%   Visual Acuity Right Eye Distance:   Left Eye Distance:   Bilateral Distance:    Right Eye Near:   Left Eye Near:    Bilateral Near:     Physical Exam Constitutional:      General: He is not in acute distress.    Appearance: Normal appearance. He is not toxic-appearing or diaphoretic.  HENT:     Head: Normocephalic and atraumatic.  Eyes:     Extraocular Movements: Extraocular movements intact.     Conjunctiva/sclera:  Conjunctivae normal.  Pulmonary:     Effort: Pulmonary effort is normal.  Genitourinary:    Comments: Deferred with shared decision making.  Self swab performed. Skin:    Comments: Scalp appears normal.  Hair is missing from front mid scalp.  There is flat discolorations that are not scaly present outlining hairline.  Neurological:     General: No focal deficit present.     Mental Status: He is alert and oriented to person, place, and time. Mental status is at baseline.  Psychiatric:        Mood and Affect: Mood normal.        Behavior: Behavior normal.        Thought Content: Thought content normal.        Judgment: Judgment normal.      UC Treatments / Results  Labs (all labs ordered are listed, but only abnormal results are displayed) Labs Reviewed  RPR  HIV ANTIBODY (ROUTINE TESTING W REFLEX)  CYTOLOGY, (ORAL, ANAL, URETHRAL) ANCILLARY ONLY    EKG   Radiology No results found.  Procedures Procedures (including critical care time)  Medications Ordered in UC Medications - No data to display  Initial Impression / Assessment and Plan / UC Course  I have reviewed the triage vital signs and the nursing notes.  Pertinent labs & imaging results that were available during my care of the patient were reviewed by me and considered in my medical decision making (see chart for details).     1.  Penile discharge Suspect possible STD exposure.  Although, given no confirmed exposure to STD will await cytology swab results before treatment.  HIV and RPR test pending per patient request as well.  Patient advised to refrain from sexual activity until test results and treatment are complete.  Patient voiced understanding and was agreeable.  2.  Scalp psoriasis Unsure exact etiology of patient's skin condition but recent dermatology note on 01/16/2023 revealed that patient has scalp psoriasis.  They prescribed triamcinolone and mometasone to apply topically.  He reports improvement  in the symptoms.  Therefore, advised patient to follow-up with dermatology at his 6-week follow-up appointment and will defer prescribing ketoconazole shampoo as I do not think it will be helpful.  Patient verbalized understanding and was agreeable with plan. Final Clinical Impressions(s) / UC Diagnoses   Final diagnoses:  Penile discharge  Screening examination for venereal disease  Psoriasis of scalp     Discharge Instructions      STD testing  is pending.  Will call if it is positive and treat as appropriate.  Refrain from sexual activity until test results and treatment are complete.  Please follow-up with dermatologist for any further recommendations.  I am not able to refill that shampoo as I do not think it will be helpful given recent dermatology note.    ED Prescriptions   None    PDMP not reviewed this encounter.   Teodora Medici,  02/04/23 [phone]

## 2023-02-04 NOTE — Discharge Instructions (Signed)
STD testing is pending.  Will call if it is positive and treat as appropriate.  Refrain from sexual activity until test results and treatment are complete.  Please follow-up with dermatologist for any further recommendations.  I am not able to refill that shampoo as I do not think it will be helpful given recent dermatology note.

## 2023-02-05 LAB — RPR: RPR Ser Ql: NONREACTIVE

## 2023-02-05 LAB — HIV ANTIBODY (ROUTINE TESTING W REFLEX): HIV Screen 4th Generation wRfx: NONREACTIVE

## 2023-02-06 LAB — CYTOLOGY, (ORAL, ANAL, URETHRAL) ANCILLARY ONLY
Chlamydia: POSITIVE — AB
Comment: NEGATIVE
Comment: NEGATIVE
Comment: NORMAL
Neisseria Gonorrhea: NEGATIVE
Trichomonas: NEGATIVE

## 2023-02-07 ENCOUNTER — Other Ambulatory Visit: Payer: Self-pay | Admitting: Dermatology

## 2023-02-07 ENCOUNTER — Telehealth (HOSPITAL_COMMUNITY): Payer: Self-pay | Admitting: Emergency Medicine

## 2023-02-07 DIAGNOSIS — L409 Psoriasis, unspecified: Secondary | ICD-10-CM

## 2023-02-07 MED ORDER — DOXYCYCLINE HYCLATE 100 MG PO CAPS: 100.0000 mg | ORAL_CAPSULE | Freq: Two times a day (BID) | ORAL | 0 refills | Status: AC

## 2023-02-08 ENCOUNTER — Ambulatory Visit
Admission: EM | Admit: 2023-02-08 | Discharge: 2023-02-08 | Disposition: A | Discharge status: Home / Self Care | Payer: Medicaid Other

## 2023-02-08 DIAGNOSIS — Z113 Encounter for screening for infections with a predominantly sexual mode of transmission: Secondary | ICD-10-CM

## 2023-02-08 DIAGNOSIS — Z719 Counseling, unspecified: Secondary | ICD-10-CM

## 2023-02-08 DIAGNOSIS — Z711 Person with feared health complaint in whom no diagnosis is made: Secondary | ICD-10-CM

## 2023-02-08 NOTE — ED Triage Notes (Signed)
Pt here for herpes testing states he was here recently for sti screening. States herpes was not on the list and he would like to be tested.

## 2023-02-08 NOTE — Discharge Instructions (Signed)
As we discussed, we are not able to test for genital herpes without any active lesions.  You may follow-up with a family medicine doctor to have blood work completed if you wish to do so.  There are no concerns for active genital herpes if you do not have any lesions.

## 2023-02-08 NOTE — ED Provider Notes (Signed)
[name]    CSN: [id number] Arrival date & time: 02/08/23  1411      History   Chief Complaint Chief Complaint  Patient presents with   sti screening    HPI Calvin Fuller is a 22 y.o. male.   Patient presents today for genital herpes screening.  He presented on 02/04/23 for STD testing.  He tested positive for chlamydia and doxycycline has been sent in for patient to take for this.  Patient reports that he was reviewing the results with his girlfriend when she told him that genital herpes testing was not included.  Therefore, he is here today to have herpes testing.  He denies any exposure to genital herpes or any active genital lesions.  Denies history of genital herpes as well.     History reviewed. No pertinent past medical history.  Patient Active Problem List   Diagnosis Date Noted   Mild concussion 08/14/2013    Past Surgical History:  Procedure Laterality Date   KNEE ARTHROSCOPY W/ ACL RECONSTRUCTION Left        Home Medications    Prior to Admission medications   Medication Sig Start Date End Date Taking? Authorizing Provider  doxycycline (VIBRAMYCIN) 100 MG capsule Take 1 capsule (100 mg total) by mouth 2 (two) times daily for 7 days. 02/07/23 02/14/23  Chase Picket, MD  hydrOXYzine (ATARAX) 25 MG tablet Take 1 tablet (25 mg total) by mouth every 6 (six) hours. Patient not taking: Reported on 01/16/2023 12/29/22   Nyoka Lint, PA-C  ketoconazole (NIZORAL) 2 % shampoo Apply 1 Application topically 2 (two) times a week. 12/29/22   Nyoka Lint, PA-C  loratadine (CLARITIN) 10 MG tablet Take 1 tablet (10 mg total) by mouth at bedtime. Patient not taking: Reported on 01/16/2023 12/29/22   Nyoka Lint, PA-C  mometasone (ELOCON) 0.1 % cream APPLY 1 APPLICATION TOPICALLY DAILY AS NEEDED TO AFFECTED AREA (RASH)ON THE FOREHEAD AND SCALP DAILY - TWICE DAILY AS NEEDED FOR UP TO 5 DAYS PER WEEK. 02/07/23   Ralene Bathe, MD  triamcinolone cream  (KENALOG) 0.1 % Apply to aa's rash on the body and arms QD-BID PRN up to 5d/wk. Avoid applying to face, groin, and axilla. Use as directed. Long-term use can cause thinning of the skin. 01/16/23   Ralene Bathe, MD  valACYclovir (VALTREX) 1000 MG tablet Take 1 tablet (1,000 mg total) by mouth 3 (three) times daily. Patient not taking: Reported on 01/16/2023 12/29/22   Nyoka Lint, PA-C    Family History Family History  Family history unknown: Yes    Social History Social History   Tobacco Use   Smoking status: Never   Smokeless tobacco: Never  Substance Use Topics   Alcohol use: No   Drug use: No     Allergies   Patient has no known allergies.   Review of Systems Review of Systems Per HPI  Physical Exam Triage Vital Signs ED Triage Vitals [02/08/23 1521]  Enc Vitals Group     BP (!) 141/84     Pulse Rate 78     Resp 16     Temp 97.8 F (36.6 C)     Temp Source Oral     SpO2 98 %     Weight      Height      Head Circumference      Peak Flow      Pain Score 0     Pain Loc  Pain Edu?      Excl. in [place]?    No data found.  Updated Vital Signs BP (!) 141/84 (BP Location: Right Arm)   Pulse 78   Temp 97.8 F (36.6 C) (Oral)   Resp 16   SpO2 98%   Visual Acuity Right Eye Distance:   Left Eye Distance:   Bilateral Distance:    Right Eye Near:   Left Eye Near:    Bilateral Near:     Physical Exam Constitutional:      General: He is not in acute distress.    Appearance: Normal appearance. He is not toxic-appearing or diaphoretic.  HENT:     Head: Normocephalic and atraumatic.  Eyes:     Extraocular Movements: Extraocular movements intact.     Conjunctiva/sclera: Conjunctivae normal.  Pulmonary:     Effort: Pulmonary effort is normal.  Neurological:     General: No focal deficit present.     Mental Status: He is alert and oriented to person, place, and time. Mental status is at baseline.  Psychiatric:        Mood and Affect: Mood normal.         Behavior: Behavior normal.        Thought Content: Thought content normal.        Judgment: Judgment normal.      UC Treatments / Results  Labs (all labs ordered are listed, but only abnormal results are displayed) Labs Reviewed - No data to display  EKG   Radiology No results found.  Procedures Procedures (including critical care time)  Medications Ordered in UC Medications - No data to display  Initial Impression / Assessment and Plan / UC Course  I have reviewed the triage vital signs and the nursing notes.  Pertinent labs & imaging results that were available during my care of the patient were reviewed by me and considered in my medical decision making (see chart for details).     Discussed with patient that since there are no active genital lesions, genital herpes testing cannot be completed today.  Also discussed with patient that blood work for herpes is not typically recommended.  Although, he can follow-up with family medicine doctor if he wishes to have this completed as this is not something that is typically done at urgent care.  Advised patient that if he develops any genital lesions, he is welcome to follow-up in urgent care to have them tested.  Encouraged completion of doxycycline for recent chlamydia infection.  Patient verbalized understanding and was agreeable with plan. Final Clinical Impressions(s) / UC Diagnoses   Final diagnoses:  Encounter for consultation     Discharge Instructions      As we discussed, we are not able to test for genital herpes without any active lesions.  You may follow-up with a family medicine doctor to have blood work completed if you wish to do so.  There are no concerns for active genital herpes if you do not have any lesions.    ED Prescriptions   None    PDMP not reviewed this encounter.   Teodora Medici, [place] 02/08/23 [phone]

## 2023-03-01 ENCOUNTER — Ambulatory Visit: Payer: Medicaid Other | Admitting: Dermatology

## 2023-06-06 ENCOUNTER — Ambulatory Visit (INDEPENDENT_AMBULATORY_CARE_PROVIDER_SITE_OTHER): Payer: Medicaid Other | Admitting: Dermatology

## 2023-06-06 VITALS — BP 160/62

## 2023-06-06 DIAGNOSIS — L409 Psoriasis, unspecified: Secondary | ICD-10-CM

## 2023-06-06 DIAGNOSIS — Z7189 Other specified counseling: Secondary | ICD-10-CM | POA: Diagnosis not present

## 2023-06-06 DIAGNOSIS — Z79899 Other long term (current) drug therapy: Secondary | ICD-10-CM

## 2023-06-06 MED ORDER — OTEZLA 30 MG PO TABS: 30.0000 mg | ORAL_TABLET | Freq: Two times a day (BID) | ORAL | 3 refills | Status: DC

## 2023-06-06 NOTE — Progress Notes (Signed)
   Follow-Up Visit   Subjective  Calvin Fuller is a 22 y.o. male who presents for the following: Psoriasis of face, trunk and legs. He is using [facility] 0.1% cream on his trunk and extremities and his trunk has improved. His using Mometasone on his face and has only improved a little bit.  The following portions of the chart were reviewed this encounter and updated as appropriate: medications, allergies, medical history  Review of Systems:  No other skin or systemic complaints except as noted in HPI or Assessment and Plan.  Objective  Well appearing patient in no apparent distress; mood and affect are within normal limits.  Areas Examined: Face, trunk, extremities  Relevant exam findings are noted in the Assessment and Plan.    Assessment & Plan    PSORIASIS Well-demarcated erythematous papules/plaques with silvery scale, guttate pink scaly papules. 18% BSA. Chronic and persistent condition with duration or expected duration over one year. Condition is symptomatic/ bothersome to patient. Not currently at goal.  Treatment Plan: Start Otezla 30 mg 1 po every day - discussed tapering up to 30 mg - samples given today and prescription sent to [facility]  Counseling on psoriasis and coordination of care  psoriasis is a chronic non-curable, but treatable genetic/hereditary disease that may have other systemic features affecting other organ systems such as joints (Psoriatic Arthritis). It is associated with an increased risk of inflammatory bowel disease, heart disease, non-alcoholic fatty liver disease, and depression.  Treatments include light and laser treatments; topical medications; and systemic medications including oral and injectables.   Return for Follow up as scheduled, Psoriasis.  I, Joanie Coddington, CMA, am acting as scribe for Armida Sans, MD .  Documentation: I have reviewed the above documentation for accuracy and completeness, and I agree with the above.  Armida Sans, MD

## 2023-06-06 NOTE — Patient Instructions (Signed)
Due to recent changes in healthcare laws, you may see results of your pathology and/or laboratory studies on MyChart before the doctors have had a chance to review them. We understand that in some cases there may be results that are confusing or concerning to you. Please understand that not all results are received at the same time and often the doctors may need to interpret multiple results in order to provide you with the best plan of care or course of treatment. Therefore, we ask that you please give us 2 business days to thoroughly review all your results before contacting the office for clarification. Should we see a critical lab result, you will be contacted sooner.   If You Need Anything After Your Visit  If you have any questions or concerns for your doctor, please call our main line at [phone] and press option 4 to reach your doctor's medical assistant. If no one answers, please leave a voicemail as directed and we will return your call as soon as possible. Messages left after 4 pm will be answered the following business day.   You may also send us a message via MyChart. We typically respond to MyChart messages within 1-2 business days.  For prescription refills, please ask your pharmacy to contact our office. Our fax number is [phone].  If you have an urgent issue when the clinic is closed that cannot wait until the next business day, you can page your doctor at the number below.    Please note that while we do our best to be available for urgent issues outside of office hours, we are not available 24/7.   If you have an urgent issue and are unable to reach us, you may choose to seek medical care at your doctor's office, retail clinic, urgent care center, or emergency room.  If you have a medical emergency, please immediately call 911 or go to the emergency department.  Pager Numbers  - Dr. Kowalski: [phone]  - Dr. Moye: [phone]  - Dr. Stewart:  [phone]  In the event of inclement weather, please call our main line at [phone] for an update on the status of any delays or closures.  Dermatology Medication Tips: Please keep the boxes that topical medications come in in order to help keep track of the instructions about where and how to use these. Pharmacies typically print the medication instructions only on the boxes and not directly on the medication tubes.   If your medication is too expensive, please contact our office at [phone] option 4 or send us a message through MyChart.   We are unable to tell what your co-pay for medications will be in advance as this is different depending on your insurance coverage. However, we may be able to find a substitute medication at lower cost or fill out paperwork to get insurance to cover a needed medication.   If a prior authorization is required to get your medication covered by your insurance company, please allow us 1-2 business days to complete this process.  Drug prices often vary depending on where the prescription is filled and some pharmacies may offer cheaper prices.  The website www.goodrx.com contains coupons for medications through different pharmacies. The prices here do not account for what the cost may be with help from insurance (it may be cheaper with your insurance), but the website can give you the price if you did not use any insurance.  - You can print the associated coupon and take it with   your prescription to the pharmacy.  - You may also stop by our office during regular business hours and pick up a GoodRx coupon card.  - If you need your prescription sent electronically to a different pharmacy, notify our office through [place] MyChart or by phone at [phone] option 4.     Si Usted Necesita Algo Despus de Su Visita  Tambin puede enviarnos un mensaje a travs de MyChart. Por lo general respondemos a los mensajes de MyChart en el transcurso de 1 a 2  das hbiles.  Para renovar recetas, por favor pida a su farmacia que se ponga en contacto con nuestra oficina. Nuestro nmero de fax es el [phone].  Si tiene un asunto urgente cuando la clnica est cerrada y que no puede esperar hasta el siguiente da hbil, puede llamar/localizar a su doctor(a) al nmero que aparece a continuacin.   Por favor, tenga en cuenta que aunque hacemos todo lo posible para estar disponibles para asuntos urgentes fuera del horario de oficina, no estamos disponibles las 24 horas del da, los 7 das de la semana.   Si tiene un problema urgente y no puede comunicarse con nosotros, puede optar por buscar atencin mdica  en el consultorio de su doctor(a), en una clnica privada, en un centro de atencin urgente o en una sala de emergencias.  Si tiene una emergencia mdica, por favor llame inmediatamente al 911 o vaya a la sala de emergencias.  Nmeros de bper  - Dr. Kowalski: [phone]  - Dra. Moye: [phone]  - Dra. Stewart: [phone]  En caso de inclemencias del tiempo, por favor llame a nuestra lnea principal al [phone] para una actualizacin sobre el estado de cualquier retraso o cierre.  Consejos para la medicacin en dermatologa: Por favor, guarde las cajas en las que vienen los medicamentos de uso tpico para ayudarle a seguir las instrucciones sobre dnde y cmo usarlos. Las farmacias generalmente imprimen las instrucciones del medicamento slo en las cajas y no directamente en los tubos del medicamento.   Si su medicamento es muy caro, por favor, pngase en contacto con nuestra oficina llamando al [phone] y presione la opcin 4 o envenos un mensaje a travs de MyChart.   No podemos decirle cul ser su copago por los medicamentos por adelantado ya que esto es diferente dependiendo de la cobertura de su seguro. Sin embargo, es posible que podamos encontrar un medicamento sustituto a menor costo o llenar un formulario para que el  seguro cubra el medicamento que se considera necesario.   Si se requiere una autorizacin previa para que su compaa de seguros cubra su medicamento, por favor permtanos de 1 a 2 das hbiles para completar este proceso.  Los precios de los medicamentos varan con frecuencia dependiendo del lugar de dnde se surte la receta y alguna farmacias pueden ofrecer precios ms baratos.  El sitio web www.goodrx.com tiene cupones para medicamentos de diferentes farmacias. Los precios aqu no tienen en cuenta lo que podra costar con la ayuda del seguro (puede ser ms barato con su seguro), pero el sitio web puede darle el precio si no utiliz ningn seguro.  - Puede imprimir el cupn correspondiente y llevarlo con su receta a la farmacia.  - Tambin puede pasar por nuestra oficina durante el horario de atencin regular y recoger una tarjeta de cupones de GoodRx.  - Si necesita que su receta se enve electrnicamente a una farmacia diferente, informe a nuestra oficina a travs de MyChart de [place]   o por telfono llamando al [phone] y presione la opcin 4.  

## 2023-06-07 ENCOUNTER — Encounter: Payer: Self-pay | Admitting: Dermatology

## 2023-06-26 ENCOUNTER — Telehealth: Payer: Self-pay

## 2023-06-26 NOTE — Telephone Encounter (Signed)
Patient called he started [place] tablets 20 days ago, he can only take 20 mg once a day, due to increased dose of 30 mg will make him have a upset stomach, patient would like to know if he can add Ketoconazole shampoo and Mometasone cream to his current regimen of Otezla 20 mg once a day, pt would also like to know if he could try Xtrac laser?

## 2023-06-27 ENCOUNTER — Emergency Department (HOSPITAL_COMMUNITY)
Admission: EM | Admit: 2023-06-27 | Discharge: 2023-06-28 | Disposition: A | Discharge status: Home / Self Care | Payer: Medicaid Other | Attending: Emergency Medicine | Admitting: Emergency Medicine

## 2023-06-27 ENCOUNTER — Ambulatory Visit
Admission: EM | Admit: 2023-06-27 | Discharge: 2023-06-27 | Disposition: A | Discharge status: Home / Self Care | Payer: Medicaid Other

## 2023-06-27 ENCOUNTER — Other Ambulatory Visit: Payer: Self-pay

## 2023-06-27 ENCOUNTER — Encounter (HOSPITAL_COMMUNITY): Payer: Self-pay

## 2023-06-27 ENCOUNTER — Telehealth: Payer: Self-pay

## 2023-06-27 DIAGNOSIS — H5711 Ocular pain, right eye: Secondary | ICD-10-CM | POA: Diagnosis not present

## 2023-06-27 DIAGNOSIS — H1031 Unspecified acute conjunctivitis, right eye: Secondary | ICD-10-CM | POA: Diagnosis not present

## 2023-06-27 DIAGNOSIS — L409 Psoriasis, unspecified: Secondary | ICD-10-CM

## 2023-06-27 DIAGNOSIS — H5789 Other specified disorders of eye and adnexa: Secondary | ICD-10-CM

## 2023-06-27 LAB — CBC
HCT: 47.5 % (ref 39.0–52.0)
Hemoglobin: 15 g/dL (ref 13.0–17.0)
MCH: 29.8 pg (ref 26.0–34.0)
MCHC: 31.6 g/dL (ref 30.0–36.0)
MCV: 94.4 fL (ref 80.0–100.0)
Platelets: 227 10*3/uL (ref 150–400)
RBC: 5.03 MIL/uL (ref 4.22–5.81)
RDW: 13 % (ref 11.5–15.5)
WBC: 6.9 10*3/uL (ref 4.0–10.5)
nRBC: 0 % (ref 0.0–0.2)

## 2023-06-27 MED ORDER — MOMETASONE FUROATE 0.1 % EX CREA: 1.0000 | TOPICAL_CREAM | Freq: Every day | CUTANEOUS | 1 refills | Status: DC | PRN

## 2023-06-27 MED ORDER — ERYTHROMYCIN 5 MG/GM OP OINT: TOPICAL_OINTMENT | OPHTHALMIC | 0 refills | Status: DC

## 2023-06-27 MED ORDER — TETRACAINE HCL 0.5 % OP SOLN
2.0000 [drp] | Freq: Once | OPHTHALMIC | Status: AC
  Administered 2023-06-27: 2 [drp] via OPHTHALMIC
  Filled 2023-06-27: qty 4

## 2023-06-27 MED ORDER — CARBOXYMETHYLCELLULOSE SODIUM 0.5 % OP SOLN: 1.0000 [drp] | Freq: Three times a day (TID) | OPHTHALMIC | 0 refills | Status: DC | PRN

## 2023-06-27 MED ORDER — FLUORESCEIN SODIUM 1 MG OP STRP
1.0000 | ORAL_STRIP | Freq: Once | OPHTHALMIC | Status: AC
  Administered 2023-06-27: 1 via OPHTHALMIC
  Filled 2023-06-27: qty 1

## 2023-06-27 MED ORDER — KETOCONAZOLE 2 % EX SHAM: 1.0000 | MEDICATED_SHAMPOO | CUTANEOUS | 1 refills | Status: DC

## 2023-06-27 NOTE — ED Notes (Signed)
Ordered medications and [place] at bedside.

## 2023-06-27 NOTE — Discharge Instructions (Signed)
Your symptoms are consistent with likely conjunctivitis please.  Utilize erythromycin ointment in addition to lubricant drops and follow-up closely with ophthalmology for recheck.

## 2023-06-27 NOTE — Discharge Instructions (Signed)
Please go to the emergency department for further evaluation.

## 2023-06-27 NOTE — ED Provider Triage Note (Signed)
Emergency Medicine Provider Triage Evaluation Note  Calvin Fuller , a 22 y.o. male  was evaluated in triage.  Pt complains of right eye red and watery discharge.  Review of Systems  Positive:  Negative:   Physical Exam  BP 139/84 (BP Location: Right Arm)   Pulse 62   Temp 98.7 F (37.1 C) (Oral)   Resp 17   SpO2 100%  Gen:   Awake, no distress   Resp:  Normal effort  MSK:   Moves extremities without difficulty  Other:    Medical Decision Making  Medically screening exam initiated at 6:27 PM.  Appropriate orders placed.  Calvin Fuller was informed that the remainder of the evaluation will be completed by another provider, this initial triage assessment does not replace that evaluation, and the importance of remaining in the ED until their evaluation is complete.  Concerned with right eye redness, swelling, and watery discharge. Sent from UC because they didn't have the correct equipment. Does not wear contacts. Some pain with quick EOM movements. Eye pain is worse in morning but gets better through day. No eye trauma.  Denies fever, nausea, vomiting.    Calvin Fuller, [place] 06/27/23 [phone]

## 2023-06-27 NOTE — Telephone Encounter (Signed)
Ketoconazole shampoo and Mometasone cream sent to [facility], we will discuss Xtrac at the next follow up visit

## 2023-06-27 NOTE — ED Triage Notes (Signed)
"  Since Friday of last week I have been having eye issues, started off red on right and very watery with some fatigue". "Bright light bothers my eyes". No injury.

## 2023-06-27 NOTE — ED Triage Notes (Signed)
Pt reports right eye redness, swelling and watery. Sent here from [facility]

## 2023-06-27 NOTE — ED Provider Notes (Signed)
[name]    CSN: [id number] Arrival date & time: 06/27/23  1230      History   Chief Complaint Chief Complaint  Patient presents with   Eye Problem    HPI Calvin Fuller is a 22 y.o. male. Pt reports R eye redness and pain since 06/23/23. Started as mildly painful with a little redness in the lateral aspect of eye. Has progressed and now sclera of R eye is very red and eye is very painful. Eye is constantly watering. Has severe photophobia - refuses visual acuity because it hurts too much to open eyes in light. His right nare is draining clear fluid, none from the left. He denies head injury, or eye injury, denies vision problems, hx eye surgery. His eyelids feel heavy like he has to strain/exert to open her eyes.    Eye Problem   History reviewed. No pertinent past medical history.  Patient Active Problem List   Diagnosis Date Noted   Mild concussion 08/14/2013    Past Surgical History:  Procedure Laterality Date   KNEE ARTHROSCOPY W/ ACL RECONSTRUCTION Left        Home Medications    Prior to Admission medications   Medication Sig Start Date End Date Taking? Authorizing Provider  Apremilast (OTEZLA) 30 MG TABS Take 1 tablet (30 mg total) by mouth 2 (two) times daily. 06/06/23   [name], MD  hydrOXYzine (ATARAX) 25 MG tablet Take 1 tablet (25 mg total) by mouth every 6 (six) hours. Patient not taking: Reported on 01/16/2023 12/29/22   [name], PA-C  ketoconazole (NIZORAL) 2 % shampoo Apply 1 Application topically 2 (two) times a week. 06/29/23   [name], MD  loratadine (CLARITIN) 10 MG tablet Take 1 tablet (10 mg total) by mouth at bedtime. Patient not taking: Reported on 01/16/2023 12/29/22   [name], PA-C  mometasone (ELOCON) 0.1 % cream APPLY 1 APPLICATION TOPICALLY DAILY AS NEEDED TO AFFECTED AREA (RASH)ON THE FOREHEAD AND SCALP DAILY - TWICE DAILY AS NEEDED FOR UP TO 5 DAYS PER WEEK. 02/07/23   [name], MD   mometasone (ELOCON) 0.1 % cream Apply 1 Application topically daily as needed (Rash). 06/27/23   [name], MD  triamcinolone cream (KENALOG) 0.1 % Apply to aa's rash on the body and arms QD-BID PRN up to 5d/wk. Avoid applying to face, groin, and axilla. Use as directed. Long-term use can cause thinning of the skin. 01/16/23   [name], MD  valACYclovir (VALTREX) 1000 MG tablet Take 1 tablet (1,000 mg total) by mouth 3 (three) times daily. Patient not taking: Reported on 01/16/2023 12/29/22   [name], PA-C    Family History Family History  Family history unknown: Yes    Social History Social History   Tobacco Use   Smoking status: Never   Smokeless tobacco: Never  Vaping Use   Vaping status: Every Day   Substances: Nicotine, Flavoring  Substance Use Topics   Alcohol use: No   Drug use: No     Allergies   Patient has no known allergies.   Review of Systems Review of Systems   Physical Exam Triage Vital Signs ED Triage Vitals  Encounter Vitals Group     BP 06/27/23 1337 117/82     Systolic BP Percentile --      Diastolic BP Percentile --      Pulse Rate 06/27/23 1337 73     Resp 06/27/23 1337 16  Temp 06/27/23 1337 98 F (36.7 C)     Temp Source 06/27/23 1337 Oral     SpO2 06/27/23 1337 99 %     Weight 06/27/23 1335 147 lb (66.7 kg)     Height 06/27/23 1335 5\' 7"  (1.702 m)     Head Circumference --      Peak Flow --      Pain Score 06/27/23 1335 6     Pain Loc --      Pain Education --      Exclude from Growth Chart --    No data found.  Updated Vital Signs BP 117/82 (BP Location: Left Arm)   Pulse 73   Temp 98 F (36.7 C) (Oral)   Resp 16   Ht 5\' 7"  (1.702 m)   Wt 147 lb (66.7 kg)   SpO2 99%   BMI 23.02 kg/m   Visual Acuity Right Eye Distance:  (Unable to obtain, "light is too much" per patient.) Left Eye Distance:  (Unable to obtain, "light is too much" per patient.) Bilateral Distance:  (Unable to obtain, "light is too  much" per patient.)  Right Eye Near:   Left Eye Near:    Bilateral Near:     Physical Exam Constitutional:      Comments: Appears uncomfortable. Sitting in darkened room  HENT:     Head: Normocephalic and atraumatic.     Nose: Rhinorrhea present.     Comments: Rhinorrhea from R nare Eyes:     Extraocular Movements: Extraocular movements intact.     Right eye: No nystagmus.     Left eye: No nystagmus.     Conjunctiva/sclera:     Right eye: Right conjunctiva is injected. No exudate.    Pupils: Pupils are equal, round, and reactive to light.     Slit lamp exam:    Right eye: Photophobia present.     Comments: R eye constantly watering.   Pt maintains B eyelids half closed - he can open them fully when he wants to but prefers to keep them barely open.   Neurological:     Mental Status: He is alert.      UC Treatments / Results  Labs (all labs ordered are listed, but only abnormal results are displayed) Labs Reviewed - No data to display  EKG   Radiology No results found.  Procedures Procedures (including critical care time)  Medications Ordered in UC Medications - No data to display  Initial Impression / Assessment and Plan / UC Course  I have reviewed the triage vital signs and the nursing notes.  Pertinent labs & imaging results that were available during my care of the patient were reviewed by me and considered in my medical decision making (see chart for details).    Painful red eye with photophobia, worsening, for 5 days. I do not have a tonopen or slit lamp. Discussed with Dr. Marlinda Mike, pt directed to ED for further eval. Instructed pt not to drive himself.   Final Clinical Impressions(s) / UC Diagnoses   Final diagnoses:  Red eye  Pain of right eye     Discharge Instructions      Please go to the emergency department for further evaluation.    ED Prescriptions   None    PDMP not reviewed this encounter.   Cathlyn Parsons, NP 06/27/23  1500

## 2023-06-27 NOTE — ED Provider Notes (Signed)
[place] EMERGENCY DEPARTMENT AT [facility] Provider Note   CSN: [id number] Arrival date & time: 06/27/23  1659     History  Chief Complaint  Patient presents with   Eye Problem    Calvin Fuller is a 22 y.o. male.   Eye Problem    22 year old male with medical history noncontributory who presents to the emergency department from urgent care with right eye pain, watery discharge.  The patient states that he has had 4 to 5 days of right eye pain with associated watery discharge, no purulence noted.  He denies any vision loss.  He endorses photophobia that is severe and has had difficulty opening his eyes due to the severe photophobia.  He denies any trauma to the eye.  He denies any sensation of foreign body in the eye.  Home Medications Prior to Admission medications   Medication Sig Start Date End Date Taking? Authorizing Provider  carboxymethylcellulose ([facility] LUBRICANT EYE DROPS) 0.5 % SOLN Place 1 drop into the right eye 3 (three) times daily as needed. 06/27/23  Yes [name], MD  erythromycin ophthalmic ointment Place a 1/2 inch ribbon of ointment into the lower eyelid four times daily for 7 days 06/27/23  Yes [name], MD  Apremilast (OTEZLA) 30 MG TABS Take 1 tablet (30 mg total) by mouth 2 (two) times daily. 06/06/23   [name], MD  hydrOXYzine (ATARAX) 25 MG tablet Take 1 tablet (25 mg total) by mouth every 6 (six) hours. Patient not taking: Reported on 01/16/2023 12/29/22   [name], PA-C  ketoconazole (NIZORAL) 2 % shampoo Apply 1 Application topically 2 (two) times a week. 06/29/23   [name], MD  loratadine (CLARITIN) 10 MG tablet Take 1 tablet (10 mg total) by mouth at bedtime. Patient not taking: Reported on 01/16/2023 12/29/22   [name], PA-C  mometasone (ELOCON) 0.1 % cream APPLY 1 APPLICATION TOPICALLY DAILY AS NEEDED TO AFFECTED AREA (RASH)ON THE FOREHEAD AND SCALP DAILY - TWICE DAILY AS NEEDED FOR UP TO 5 DAYS PER WEEK.  02/07/23   [name], MD  mometasone (ELOCON) 0.1 % cream Apply 1 Application topically daily as needed (Rash). 06/27/23   [name], MD  triamcinolone cream (KENALOG) 0.1 % Apply to aa's rash on the body and arms QD-BID PRN up to 5d/wk. Avoid applying to face, groin, and axilla. Use as directed. Long-term use can cause thinning of the skin. 01/16/23   [name], MD  valACYclovir (VALTREX) 1000 MG tablet Take 1 tablet (1,000 mg total) by mouth 3 (three) times daily. Patient not taking: Reported on 01/16/2023 12/29/22   [name], PA-C      Allergies    Patient has no known allergies.    Review of Systems   Review of Systems  All other systems reviewed and are negative.   Physical Exam Updated Vital Signs BP (!) 132/97   Pulse (!) 58   Temp 98.7 F (37.1 C) (Oral)   Resp 16   SpO2 100%  Physical Exam Vitals and nursing note reviewed.  Constitutional:      General: He is not in acute distress. HENT:     Head: Normocephalic and atraumatic.  Eyes:     General: Vision grossly intact. Gaze aligned appropriately. No visual field deficit.       Right eye: Discharge present. No foreign body.     Intraocular pressure: Right eye pressure is 16 mmHg. Measurements were taken using  a handheld tonometer.    Extraocular Movements: Extraocular movements intact.     Conjunctiva/sclera:     Right eye: Right conjunctiva is injected. Chemosis present. No exudate or hemorrhage.    Pupils: Pupils are equal, round, and reactive to light.     Right eye: No corneal abrasion or fluorescein uptake. Seidel exam negative.     Comments: Visual acuity: 20/20 OU, OS and OD  Cardiovascular:     Rate and Rhythm: Normal rate and regular rhythm.  Pulmonary:     Effort: Pulmonary effort is normal. No respiratory distress.  Abdominal:     General: There is no distension.     Tenderness: There is no guarding.  Musculoskeletal:        General: No deformity or signs of injury.      Cervical back: Neck supple.  Skin:    Findings: No lesion or rash.  Neurological:     General: No focal deficit present.     Mental Status: He is alert. Mental status is at baseline.     ED Results / Procedures / Treatments   Labs (all labs ordered are listed, but only abnormal results are displayed) Labs Reviewed  CBC    EKG None  Radiology No results found.  Procedures Procedures    Medications Ordered in ED Medications  tetracaine (PONTOCAINE) 0.5 % ophthalmic solution 2 drop (has no administration in time range)  fluorescein ophthalmic strip 1 strip (has no administration in time range)    ED Course/ Medical Decision Making/ A&P                                 Medical Decision Making Risk OTC drugs. Prescription drug management.    22 year old male with medical history noncontributory who presents to the emergency department from urgent care with right eye pain, watery discharge.  The patient states that he has had 4 to 5 days of right eye pain with associated watery discharge, no purulence noted.  He denies any vision loss.  He endorses photophobia that is severe and has had difficulty opening his eyes due to the severe photophobia.  He denies any trauma to the eye.  He denies any sensation of foreign body in the eye.  On arrival, the patient was vitally stable.  Physical exam revealed right eye conjunctival injection.  The patient's symptoms improved following tetracaine drops to the right eye.  Fluorescein staining was performed with no evidence of fluorescein uptake to indicate corneal abrasion or ulceration.  The patient pupils were 2 mm and reactive to light.  Tono-Pen reading hand-held revealed pressures between 16 and 18 mmHg. No evidence for acute angle-closure glaucoma.  No evidence of eye trauma.  Patient visual acuity is intact.  Ophthalmology consult: Spoke with Dr. Essie Hart, recommended artificial tears and erythromycin ointment, close follow-up in clinic  for a repeat assessment.    Final Clinical Impression(s) / ED Diagnoses Final diagnoses:  Acute conjunctivitis of right eye, unspecified acute conjunctivitis type    Rx / DC Orders ED Discharge Orders          Ordered    erythromycin ophthalmic ointment        06/27/23 2211    carboxymethylcellulose ([facility] LUBRICANT EYE DROPS) 0.5 % SOLN  3 times daily PRN        06/27/23 2211    Ambulatory referral to Ophthalmology        06/27/23 2212  [name], MD 06/27/23 2233

## 2023-06-27 NOTE — ED Notes (Signed)
Patient arrived to room ambulatory with steady gait wearing sunglasses. Patient sent by UC for 4 day hx of right eye watering red sclera itchy and severe photophobia. Patient refusing visual acuity states it hurts to bad to open his eyes.

## 2023-06-28 NOTE — ED Notes (Signed)
Pt provided with AVS.  Education complete; all questions answered.  Pt leaving ED in stable condition at this time, ambulatory with all belongings. 

## 2023-07-05 ENCOUNTER — Telehealth: Payer: Self-pay

## 2023-07-05 NOTE — Telephone Encounter (Signed)
Patient left nurse VM asking for two Rfs on creams.  Called patien to confirm Mometasone and Triamcinolone from first visit. No answer and VM box full. aw

## 2023-07-10 ENCOUNTER — Telehealth: Payer: Self-pay

## 2023-07-10 DIAGNOSIS — L409 Psoriasis, unspecified: Secondary | ICD-10-CM

## 2023-07-10 NOTE — Telephone Encounter (Signed)
Patient left nurse voicemail unable to tolerate [place].  He is asking for refill of Triamcinolone Cream and Ketoconazole Shampoo.  Patient scheduled for follow up 08/09/23. aw

## 2023-07-10 NOTE — Telephone Encounter (Signed)
Patient called and left message on nurse line again Friday.  Called patient back, no answer, VM box full. aw

## 2023-07-11 MED ORDER — KETOCONAZOLE 2 % EX SHAM: 1.0000 | MEDICATED_SHAMPOO | CUTANEOUS | 0 refills | Status: DC

## 2023-07-11 MED ORDER — TRIAMCINOLONE ACETONIDE 0.1 % EX CREA
TOPICAL_CREAM | CUTANEOUS | 0 refills | Status: DC
Start: 2023-07-11 — End: 2024-04-21

## 2023-07-11 NOTE — Telephone Encounter (Signed)
Patient advised. aw

## 2023-07-11 NOTE — Telephone Encounter (Addendum)
Prescription Rfs sent in. Called patient, no answer and VM box full. aw

## 2023-07-13 ENCOUNTER — Ambulatory Visit (INDEPENDENT_AMBULATORY_CARE_PROVIDER_SITE_OTHER): Payer: Medicaid Other | Admitting: Dermatology

## 2023-07-13 ENCOUNTER — Ambulatory Visit: Payer: Medicaid Other | Admitting: Dermatology

## 2023-07-13 ENCOUNTER — Encounter: Payer: Self-pay | Admitting: Dermatology

## 2023-07-13 DIAGNOSIS — Z79899 Other long term (current) drug therapy: Secondary | ICD-10-CM

## 2023-07-13 DIAGNOSIS — L409 Psoriasis, unspecified: Secondary | ICD-10-CM

## 2023-07-13 DIAGNOSIS — Z7189 Other specified counseling: Secondary | ICD-10-CM

## 2023-07-13 MED ORDER — ROFLUMILAST 0.3 % EX CREA
TOPICAL_CREAM | CUTANEOUS | 2 refills | Status: DC
Start: 2023-07-13 — End: 2023-09-05

## 2023-07-13 MED ORDER — RISANKIZUMAB-RZAA 150 MG/ML ~~LOC~~ SOAJ: SUBCUTANEOUS | 1 refills | Status: DC

## 2023-07-13 MED ORDER — RISANKIZUMAB-RZAA 150 MG/ML ~~LOC~~ SOAJ
SUBCUTANEOUS | 0 refills | Status: DC
Start: 2023-07-13 — End: 2024-10-02

## 2023-07-13 MED ORDER — CLOBETASOL PROPIONATE 0.05 % EX SOLN
1.0000 | Freq: Two times a day (BID) | CUTANEOUS | 2 refills | Status: DC
Start: 2023-07-13 — End: 2024-04-21

## 2023-07-13 NOTE — Patient Instructions (Addendum)
Treatment Plan: Will send in Ramona. Pending labs, can have patient come in to office to have sample of Skyrizi. Start clobetasol solution to use to scalp twice daily as needed. Avoid applying to face, groin, and axilla. Use as directed. Long-term use can cause thinning of the skin. Start Zoryve once daily to affected areas at face and body Continue triamcinolone 0.1% 1-2 times daily to affected areas of bosy as needed. Avoid applying to face, groin, and axilla. Use as directed. Long-term use can cause thinning of the skin.  Topical steroids (such as triamcinolone, fluocinolone, fluocinonide, mometasone, clobetasol, halobetasol, betamethasone, hydrocortisone) can cause thinning and lightening of the skin if they are used for too long in the same area. Your physician has selected the right strength medicine for your problem and area affected on the body. Please use your medication only as directed by your physician to prevent side effects.    Counseling on psoriasis and coordination of care  psoriasis is a chronic non-curable, but treatable genetic/hereditary disease that may have other systemic features affecting other organ systems such as joints (Psoriatic Arthritis). It is associated with an increased risk of inflammatory bowel disease, heart disease, non-alcoholic fatty liver disease, and depression.  Treatments include light and laser treatments; topical medications; and systemic medications including oral and injectables.  Reviewed risks of biologics including immunosuppression, infections, injection site reaction, and failure to improve condition. Goal is control of skin condition, not cure.  Some older biologics such as Humira and Enbrel may slightly increase risk of malignancy and may worsen congestive heart failure.  Taltz and Cosentyx may cause inflammatory bowel disease to flare. The use of biologics requires long term medication management, including periodic office visits and monitoring of  blood work.   Due to recent changes in healthcare laws, you may see results of your pathology and/or laboratory studies on MyChart before the doctors have had a chance to review them. We understand that in some cases there may be results that are confusing or concerning to you. Please understand that not all results are received at the same time and often the doctors may need to interpret multiple results in order to provide you with the best plan of care or course of treatment. Therefore, we ask that you please give [place] 2 business days to thoroughly review all your results before contacting the office for clarification. Should we see a critical lab result, you will be contacted sooner.   If You Need Anything After Your Visit  If you have any questions or concerns for your doctor, please call our main line at [phone] and press option 4 to reach your doctor's medical assistant. If no one answers, please leave a voicemail as directed and we will return your call as soon as possible. Messages left after 4 pm will be answered the following business day.   You may also send [place] a message via MyChart. We typically respond to MyChart messages within 1-2 business days.  For prescription refills, please ask your pharmacy to contact our office. Our fax number is [phone].  If you have an urgent issue when the clinic is closed that cannot wait until the next business day, you can page your doctor at the number below.    Please note that while we do our best to be available for urgent issues outside of office hours, we are not available 24/7.   If you have an urgent issue and are unable to reach [place], you may choose to seek  medical care at your doctor's office, retail clinic, urgent care center, or emergency room.  If you have a medical emergency, please immediately call 911 or go to the emergency department.  Pager Numbers  - Dr. Gwen Pounds: [phone]  - Dr. Roseanne Reno: [phone]  - Dr. Katrinka Blazing:  [phone]   In the event of inclement weather, please call our main line at [phone] for an update on the status of any delays or closures.  Dermatology Medication Tips: Please keep the boxes that topical medications come in in order to help keep track of the instructions about where and how to use these. Pharmacies typically print the medication instructions only on the boxes and not directly on the medication tubes.   If your medication is too expensive, please contact our office at [phone] option 4 or send [place] a message through MyChart.   We are unable to tell what your co-pay for medications will be in advance as this is different depending on your insurance coverage. However, we may be able to find a substitute medication at lower cost or fill out paperwork to get insurance to cover a needed medication.   If a prior authorization is required to get your medication covered by your insurance company, please allow [place] 1-2 business days to complete this process.  Drug prices often vary depending on where the prescription is filled and some pharmacies may offer cheaper prices.  The website www.goodrx.com contains coupons for medications through different pharmacies. The prices here do not account for what the cost may be with help from insurance (it may be cheaper with your insurance), but the website can give you the price if you did not use any insurance.  - You can print the associated coupon and take it with your prescription to the pharmacy.  - You may also stop by our office during regular business hours and pick up a GoodRx coupon card.  - If you need your prescription sent electronically to a different pharmacy, notify our office through [facility] or by phone at [phone] option 4.     Si Usted Necesita Algo Despus de Su Visita  Tambin puede enviarnos un mensaje a travs de Clinical cytogeneticist. Por lo general respondemos a los mensajes de MyChart en el transcurso de 1 a  2 das hbiles.  Para renovar recetas, por favor pida a su farmacia que se ponga en contacto con nuestra oficina. Annie Sable de fax es [place] [phone].  Si tiene un asunto urgente cuando la clnica est cerrada y que no puede esperar hasta el siguiente da hbil, puede llamar/localizar a su doctor(a) al nmero que aparece a continuacin.   Por favor, tenga en cuenta que aunque hacemos todo lo posible para estar disponibles para asuntos urgentes fuera del horario de [place], no estamos disponibles las 24 horas del da, los 7 [address]  [address] de la [place].   Si tiene un problema urgente y no puede comunicarse con nosotros, puede optar por buscar atencin mdica  en el consultorio de su doctor(a), en una clnica privada, en un centro de atencin urgente o en una sala de emergencias.  Si tiene Engineer, drilling, por favor llame inmediatamente al 911 o vaya a la sala de emergencias.  Nmeros de bper  - Dr. Gwen Pounds: [phone]  - Dra. Roseanne Reno: [phone]  - Dr. Katrinka Blazing: [phone]   En caso de inclemencias del tiempo, por favor llame a Lacy Duverney principal al [phone] para una actualizacin sobre el [place] [facility] de cualquier retraso o cierre.  Consejos  para Tourist information centre manager en dermatologa: Por favor, guarde las cajas en las que vienen los medicamentos de uso tpico para ayudarle a seguir las instrucciones sobre dnde y cmo usarlos. Las farmacias generalmente imprimen las instrucciones del medicamento slo en las cajas y no directamente en los tubos del [place].   Si su medicamento es muy caro, por favor, pngase en contacto con Rolm Gala llamando al [phone] y presione la opcin 4 o envenos un mensaje a travs de Clinical cytogeneticist.   No podemos decirle cul ser su copago por los medicamentos por adelantado ya que esto es diferente dependiendo de la cobertura de su seguro. Sin embargo, es posible que podamos encontrar un medicamento sustituto a Audiological scientist un formulario para que  el seguro cubra el medicamento que se considera necesario.   Si se requiere una autorizacin previa para que su compaa de seguros [place] su medicamento, por favor permtanos de 1 a 2 das hbiles para completar [address]  Los precios de los medicamentos varan con frecuencia dependiendo del Environmental consultant de dnde se surte la receta y alguna farmacias pueden ofrecer precios ms baratos.  El sitio web www.goodrx.com tiene cupones para medicamentos de Health and safety inspector. Los precios aqu no tienen en cuenta lo que podra costar con la ayuda del seguro (puede ser ms barato con su seguro), pero el sitio web puede darle el precio si no utiliz Tourist information centre manager.  - Puede imprimir el cupn correspondiente y llevarlo con su receta a la farmacia.  - Tambin puede pasar por nuestra oficina durante el horario de atencin regular y Education officer, museum una tarjeta de cupones de GoodRx.  - Si necesita que su receta se enve electrnicamente a una farmacia diferente, informe a nuestra oficina a travs de MyChart de [place] o por telfono llamando al [phone] y presione la opcin 4.

## 2023-07-13 NOTE — Progress Notes (Signed)
Follow-Up Visit   Subjective  Calvin Fuller is a 22 y.o. male who presents for the following: Psoriasis  Patient currently using ketoconazole 2% shampoo 2-3 times weekly and TMC 0.1% at body. Patient is flared at scalp and face today. Patient was prescribed Otezla at last visit but was unable to tolerate 30 mg daily. Last dose was about 1 week ago.  The following portions of the chart were reviewed this encounter and updated as appropriate: medications, allergies, medical history  Review of Systems:  No other skin or systemic complaints except as noted in HPI or Assessment and Plan.  Objective  Well appearing patient in no apparent distress; mood and affect are within normal limits.  Areas Examined: Scalp, face, legs, arms, trunk  Relevant exam findings are noted in the Assessment and Plan.      Assessment & Plan   Psoriasis  Related Medications mometasone (ELOCON) 0.1 % cream APPLY 1 APPLICATION TOPICALLY DAILY AS NEEDED TO AFFECTED AREA (RASH)ON THE FOREHEAD AND SCALP DAILY - TWICE DAILY AS NEEDED FOR UP TO 5 DAYS PER WEEK.  triamcinolone cream (KENALOG) 0.1 % Apply to aa's rash on the body and arms QD-BID PRN up to 5d/wk. Avoid applying to face, groin, and axilla. Use as directed. Long-term use can cause thinning of the skin.  risankizumab-rzaa (SKYRIZI) 150 MG/ML pen Inject unde the skin every 12 weeks thereafter  risankizumab-rzaa (SKYRIZI) 150 MG/ML pen 150 mg subQ at Week 0 and Week 4  Roflumilast 0.3 % CREA Apply once daily to affected areas of psoriasis at face and body  clobetasol (TEMOVATE) 0.05 % external solution Apply 1 Application topically 2 (two) times daily. To scalp as needed. Avoid applying to face, groin, and axilla. Use as directed. Long-term use can cause thinning of the skin.  Long-term use of high-risk medication  Related Procedures Hepatitis B surface antigen Hepatitis B surface antibody,qualitative Hepatitis C antibody HIV  Antibody (routine testing w rflx) Hepatitis B core antibody, total QuantiFERON-TB Gold Plus CMP    PSORIASIS, chronic and flaring, 40% BSA, not at patient goal Failed apremilast, mometasone, triamcinolone Well-demarcated erythematous papules/plaques with silvery scale on trunk, extremities, scalp, face.  Patient denies joint pain  Treatment Plan: [place] Bake. Pending labs, can have patient come in to office to have sample of Skyrizi. Start Skyrizi 150 mg subQ at weeks 0, 4, then every 12 Start clobetasol solution to use to scalp twice daily as needed. Avoid applying to face, groin, and axilla. Use as directed. Long-term use can cause thinning of the skin. Start Zoryve once daily to affected areas at face and body Lot #/ [facility]  Exp: 03/2024 Continue TMC 0.1% 1-2 times daily to affected areas of body as needed. Avoid applying to face, groin, and axilla. Use as directed. Long-term use can cause thinning of the skin.  Topical steroids (such as triamcinolone, fluocinolone, fluocinonide, mometasone, clobetasol, halobetasol, betamethasone, hydrocortisone) can cause thinning and lightening of the skin if they are used for too long in the same area. Your physician has selected the right strength medicine for your problem and area affected on the body. Please use your medication only as directed by your physician to prevent side effects.    Counseling on psoriasis and coordination of care  psoriasis is a chronic non-curable, but treatable genetic/hereditary disease that may have other systemic features affecting other organ systems such as joints (Psoriatic Arthritis). It is associated with an increased risk of inflammatory bowel disease, heart disease, non-alcoholic fatty liver  disease, and depression.  Treatments include light and laser treatments; topical medications; and systemic medications including oral and injectables.  Reviewed risks of biologics including immunosuppression, infections,  injection site reaction, and failure to improve condition. Goal is control of skin condition, not cure.  Some older biologics such as Humira and Enbrel may slightly increase risk of malignancy and may worsen congestive heart failure.  Taltz and Cosentyx may cause inflammatory bowel disease to flare. The use of biologics requires long term medication management, including periodic office visits and monitoring of blood work.  Patient with no personal hx or fhx MS, no heart conditions, no active cancers, no liver problems, no known TB exposure, no IBD. Patient's grandmother with lupus.   Return in about 2 months (around 09/12/2023) for Psoriasis.  Anise Salvo, RMA, am acting as scribe for [name], MD .   Documentation: I have reviewed the above documentation for accuracy and completeness, and I agree with the above.  [name], MD

## 2023-07-15 ENCOUNTER — Encounter: Payer: Self-pay | Admitting: Dermatology

## 2023-08-09 ENCOUNTER — Ambulatory Visit: Payer: Medicaid Other | Admitting: Dermatology

## 2023-08-14 ENCOUNTER — Ambulatory Visit: Payer: Medicaid Other | Admitting: Dermatology

## 2023-08-14 ENCOUNTER — Telehealth: Payer: Medicaid Other | Admitting: Physician Assistant

## 2023-08-14 ENCOUNTER — Other Ambulatory Visit: Payer: Self-pay

## 2023-08-14 MED ORDER — KETOCONAZOLE 2 % EX SHAM: 1.0000 | MEDICATED_SHAMPOO | CUTANEOUS | 6 refills | Status: DC

## 2023-08-14 NOTE — Progress Notes (Signed)
The patient no-showed for appointment despite this provider sending direct link, reaching out via phone with no response and waiting for at least 10 minutes from appointment time for patient to join. They will be marked as a NS for this appointment/time.   However, patient was sent a message to advise that we do not treat STI without testing and in-person evaluation was recommended. Marked no charge.   Margaretann Loveless, PA-C

## 2023-08-16 ENCOUNTER — Ambulatory Visit
Admission: EM | Admit: 2023-08-16 | Discharge: 2023-08-16 | Disposition: A | Discharge status: Home / Self Care | Payer: Medicaid Other | Attending: Physician Assistant | Admitting: Physician Assistant

## 2023-08-16 DIAGNOSIS — R109 Unspecified abdominal pain: Secondary | ICD-10-CM | POA: Diagnosis not present

## 2023-08-16 DIAGNOSIS — Z113 Encounter for screening for infections with a predominantly sexual mode of transmission: Secondary | ICD-10-CM | POA: Diagnosis present

## 2023-08-16 LAB — POCT URINALYSIS DIP (MANUAL ENTRY)
Bilirubin, UA: NEGATIVE
Blood, UA: NEGATIVE
Glucose, UA: NEGATIVE mg/dL
Ketones, POC UA: NEGATIVE mg/dL
Leukocytes, UA: NEGATIVE
Nitrite, UA: NEGATIVE
Spec Grav, UA: 1.02 (ref 1.010–1.025)
Urobilinogen, UA: 1 U/dL
pH, UA: 8.5 — AB (ref 5.0–8.0)

## 2023-08-16 NOTE — ED Triage Notes (Signed)
Pt presents to UC w/ c/o penile discharge, abd cramps, "tremors in the scrotum" x2 weeks.

## 2023-08-16 NOTE — ED Provider Notes (Signed)
[name]    CSN: [id number] Arrival date & time: 08/16/23  1557      History   Chief Complaint No chief complaint on file.   HPI Calvin Fuller is a 22 y.o. male.   Patient here today for evaluation of penile discharge, abdominal cramps and tremors in his scrotum he has had for 2 weeks.  He denies any known STD exposures but would like STD screening if possible.  He denies any genital rashes or lesions.  He has not any fever.  He denies any joint pain.  The history is provided by the patient.    History reviewed. No pertinent past medical history.  Patient Active Problem List   Diagnosis Date Noted   History of repair of ACL 12/14/2017   Mild concussion 08/14/2013    Past Surgical History:  Procedure Laterality Date   KNEE ARTHROSCOPY W/ ACL RECONSTRUCTION Left        Home Medications    Prior to Admission medications   Medication Sig Start Date End Date Taking? Authorizing Provider  azithromycin (ZITHROMAX) 500 MG tablet Take 500 mg by mouth daily. 12/04/20  Yes [provider]  doxycycline (VIBRA-TABS) 100 MG tablet Take 100 mg by mouth 2 (two) times daily. 11/04/20  Yes [provider]  Apremilast (OTEZLA) 30 MG TABS Take 1 tablet (30 mg total) by mouth 2 (two) times daily. Patient not taking: Reported on 07/13/2023 06/06/23   [name], MD  carboxymethylcellulose ([employer] LUBRICANT EYE DROPS) 0.5 % SOLN Place 1 drop into the right eye 3 (three) times daily as needed. Patient not taking: Reported on 07/13/2023 06/27/23   [name], MD  clobetasol (TEMOVATE) 0.05 % external solution Apply 1 Application topically 2 (two) times daily. To scalp as needed. Avoid applying to face, groin, and axilla. Use as directed. Long-term use can cause thinning of the skin. 07/13/23   [name], MD  erythromycin ophthalmic ointment Place a 1/2 inch ribbon of ointment into the lower eyelid four times daily for 7 days Patient not  taking: Reported on 07/13/2023 06/27/23   [name], MD  hydrOXYzine (ATARAX) 25 MG tablet Take 1 tablet (25 mg total) by mouth every 6 (six) hours. Patient not taking: Reported on 01/16/2023 12/29/22   [name], PA-C  ketoconazole (NIZORAL) 2 % shampoo Apply 1 Application topically 2 (two) times a week. 08/14/23   [name], MD  loratadine (CLARITIN) 10 MG tablet Take 1 tablet (10 mg total) by mouth at bedtime. Patient not taking: Reported on 01/16/2023 12/29/22   [name], PA-C  loratadine (CLARITIN) 10 MG tablet Take by mouth daily.    [provider]  mometasone (ELOCON) 0.1 % cream APPLY 1 APPLICATION TOPICALLY DAILY AS NEEDED TO AFFECTED AREA (RASH)ON THE FOREHEAD AND SCALP DAILY - TWICE DAILY AS NEEDED FOR UP TO 5 DAYS PER WEEK. Patient not taking: Reported on 07/13/2023 02/07/23   [name], MD  mometasone (ELOCON) 0.1 % cream Apply 1 Application topically daily as needed (Rash). Patient not taking: Reported on 07/13/2023 06/27/23   [name], MD  [name]) 150 MG/ML pen Inject unde the skin every 12 weeks thereafter 07/13/23   [name], MD  [name]) 150 MG/ML pen 150 mg subQ at Week 0 and Week 4 07/13/23   [name], MD  Roflumilast 0.3 % CREA Apply once daily to affected areas of psoriasis at face and body 07/13/23   [name], MD  triamcinolone  cream (KENALOG) 0.1 % Apply to aa's rash on the body and arms QD-BID PRN up to 5d/wk. Avoid applying to face, groin, and axilla. Use as directed. Long-term use can cause thinning of the skin. 07/11/23   [name], MD  valACYclovir (VALTREX) 1000 MG tablet Take 1 tablet (1,000 mg total) by mouth 3 (three) times daily. Patient not taking: Reported on 01/16/2023 12/29/22   [name], PA-C    Family History Family History  Family history unknown: Yes    Social History Social History   Tobacco Use   Smoking status: Never   Smokeless  tobacco: Never  Vaping Use   Vaping status: Every Day   Substances: Nicotine, Flavoring  Substance Use Topics   Alcohol use: No   Drug use: No     Allergies   Patient has no known allergies.   Review of Systems Review of Systems  Constitutional:  Negative for chills and fever.  Eyes:  Negative for discharge and redness.  Respiratory:  Negative for shortness of breath.   Gastrointestinal:  Positive for abdominal pain.  Genitourinary:  Positive for penile discharge. Negative for genital sores.  Neurological:  Negative for numbness.     Physical Exam Triage Vital Signs ED Triage Vitals  Encounter Vitals Group     BP 08/16/23 1705 122/78     Systolic BP Percentile --      Diastolic BP Percentile --      Pulse Rate 08/16/23 1705 74     Resp 08/16/23 1705 16     Temp 08/16/23 1705 98.4 F (36.9 C)     Temp Source 08/16/23 1705 Oral     SpO2 08/16/23 1705 98 %     Weight --      Height --      Head Circumference --      Peak Flow --      Pain Score 08/16/23 1708 0     Pain Loc --      Pain Education --      Exclude from Growth Chart --    No data found.  Updated Vital Signs BP 122/78 (BP Location: Left Arm)   Pulse 74   Temp 98.4 F (36.9 C) (Oral)   Resp 16   SpO2 98%     Physical Exam Vitals and nursing note reviewed.  Constitutional:      General: He is not in acute distress.    Appearance: Normal appearance. He is not ill-appearing.  HENT:     Head: Normocephalic and atraumatic.  Eyes:     Conjunctiva/sclera: Conjunctivae normal.  Cardiovascular:     Rate and Rhythm: Normal rate.  Pulmonary:     Effort: Pulmonary effort is normal. No respiratory distress.  Neurological:     Mental Status: He is alert.  Psychiatric:        Mood and Affect: Mood normal.        Behavior: Behavior normal.        Thought Content: Thought content normal.      UC Treatments / Results  Labs (all labs ordered are listed, but only abnormal results are  displayed) Labs Reviewed  POCT URINALYSIS DIP (MANUAL ENTRY) - Abnormal; Notable for the following components:      Result Value   pH, UA 8.5 (*)    Protein Ur, POC trace (*)    All other components within normal limits  RPR  HIV ANTIBODY (ROUTINE TESTING W REFLEX)  HEPATITIS PANEL, ACUTE  CYTOLOGY, (ORAL, ANAL, URETHRAL) ANCILLARY ONLY    EKG   Radiology No results found.  Procedures Procedures (including critical care time)  Medications Ordered in UC Medications - No data to display  Initial Impression / Assessment and Plan / UC Course  I have reviewed the triage vital signs and the nursing notes.  Pertinent labs & imaging results that were available during my care of the patient were reviewed by me and considered in my medical decision making (see chart for details).    UA without signs of UTI.  Will order STD screening including blood work at patient's request.  Will await results further recommendation.  Advised to abstain from sexual intercourse while awaiting results.  Encouraged follow-up with any further concerns.  Final Clinical Impressions(s) / UC Diagnoses   Final diagnoses:  Screening for STD (sexually transmitted disease)   Discharge Instructions   None    ED Prescriptions   None    PDMP not reviewed this encounter.   Tomi Bamberger, PA-C 08/16/23 1757

## 2023-08-17 LAB — CYTOLOGY, (ORAL, ANAL, URETHRAL) ANCILLARY ONLY
Chlamydia: NEGATIVE
Comment: NEGATIVE
Comment: NORMAL
Neisseria Gonorrhea: NEGATIVE

## 2023-08-18 ENCOUNTER — Encounter: Payer: Self-pay | Admitting: Physician Assistant

## 2023-08-18 ENCOUNTER — Telehealth: Payer: Self-pay | Admitting: Emergency Medicine

## 2023-08-18 LAB — HIV ANTIBODY (ROUTINE TESTING W REFLEX): HIV Screen 4th Generation wRfx: NONREACTIVE

## 2023-08-18 LAB — RPR: RPR Ser Ql: NONREACTIVE

## 2023-08-18 NOTE — Telephone Encounter (Signed)
Patient left a voicemail requesting a call back to review his labs.   Attempted to reach patient x 1, VM is not setup

## 2023-08-21 ENCOUNTER — Telehealth: Payer: Self-pay

## 2023-08-21 NOTE — Telephone Encounter (Signed)
Patient called and left voicemail that he had lab work done by urgent care and wanted you to review. I don't think it was everything you were looking for.  Also patient no showed follow up and no future appointments scheduled.

## 2023-08-22 NOTE — Telephone Encounter (Signed)
Called patient. No answer and VM box full. aw

## 2023-08-22 NOTE — Telephone Encounter (Signed)
Called patient again this afternoon. No answer and voicemail box full. aw

## 2023-09-03 LAB — COMPREHENSIVE METABOLIC PANEL
ALT: 15 [IU]/L (ref 0–44)
AST: 28 [IU]/L (ref 0–40)
Albumin: 4.6 g/dL (ref 4.3–5.2)
Alkaline Phosphatase: 70 [IU]/L (ref 44–121)
BUN/Creatinine Ratio: 15 (ref 9–20)
BUN: 18 mg/dL (ref 6–20)
Bilirubin Total: 0.6 mg/dL (ref 0.0–1.2)
CO2: 24 mmol/L (ref 20–29)
Calcium: 9.4 mg/dL (ref 8.7–10.2)
Chloride: 100 mmol/L (ref 96–106)
Creatinine, Ser: 1.21 mg/dL (ref 0.76–1.27)
Globulin, Total: 2.2 g/dL (ref 1.5–4.5)
Glucose: 93 mg/dL (ref 70–99)
Potassium: 4.5 mmol/L (ref 3.5–5.2)
Sodium: 139 mmol/L (ref 134–144)
Total Protein: 6.8 g/dL (ref 6.0–8.5)
eGFR: 86 mL/min/{1.73_m2} (ref >59)

## 2023-09-03 LAB — HEPATITIS C ANTIBODY: Hep C Virus Ab: NONREACTIVE

## 2023-09-03 LAB — QUANTIFERON-TB GOLD PLUS
QuantiFERON Mitogen Value: >10 [IU]/mL
QuantiFERON Nil Value: 0.01 [IU]/mL
QuantiFERON TB1 Ag Value: 0.01 [IU]/mL
QuantiFERON TB2 Ag Value: 0.02 [IU]/mL
QuantiFERON-TB Gold Plus: NEGATIVE

## 2023-09-03 LAB — HEPATITIS B SURFACE ANTIGEN: Hepatitis B Surface Ag: NEGATIVE

## 2023-09-03 LAB — HIV ANTIBODY (ROUTINE TESTING W REFLEX): HIV Screen 4th Generation wRfx: NONREACTIVE

## 2023-09-03 LAB — HEPATITIS B SURFACE ANTIBODY,QUALITATIVE: Hep B Surface Ab, Qual: NONREACTIVE

## 2023-09-05 ENCOUNTER — Encounter: Payer: Self-pay | Admitting: Dermatology

## 2023-09-05 ENCOUNTER — Ambulatory Visit (INDEPENDENT_AMBULATORY_CARE_PROVIDER_SITE_OTHER): Payer: Medicaid Other | Admitting: Dermatology

## 2023-09-05 VITALS — BP 120/76 | Wt 146.8 lb

## 2023-09-05 DIAGNOSIS — L409 Psoriasis, unspecified: Secondary | ICD-10-CM | POA: Diagnosis not present

## 2023-09-05 DIAGNOSIS — L649 Androgenic alopecia, unspecified: Secondary | ICD-10-CM | POA: Diagnosis not present

## 2023-09-05 DIAGNOSIS — Z79899 Other long term (current) drug therapy: Secondary | ICD-10-CM | POA: Diagnosis not present

## 2023-09-05 DIAGNOSIS — Z7189 Other specified counseling: Secondary | ICD-10-CM

## 2023-09-05 MED ORDER — ZORYVE 0.3 % EX CREA: 1.0000 | TOPICAL_CREAM | Freq: Every day | CUTANEOUS | 6 refills | Status: DC

## 2023-09-05 MED ORDER — MINOXIDIL 2.5 MG PO TABS: 2.5000 mg | ORAL_TABLET | Freq: Every day | ORAL | 1 refills | Status: DC

## 2023-09-05 MED ORDER — RISANKIZUMAB-RZAA 150 MG/ML ~~LOC~~ SOAJ
150.0000 mg | Freq: Once | SUBCUTANEOUS | Status: AC
Start: 2023-09-05 — End: 2023-09-05
  Administered 2023-09-05: 150 mg via SUBCUTANEOUS

## 2023-09-05 MED ORDER — KETOCONAZOLE 2 % EX SHAM: 1.0000 | MEDICATED_SHAMPOO | CUTANEOUS | 6 refills | Status: DC

## 2023-09-05 NOTE — Patient Instructions (Signed)

## 2023-09-05 NOTE — Progress Notes (Signed)
Follow-Up Visit   Subjective  Calvin Fuller is a 22 y.o. male who presents for the following: Psoriasis face, scalp, trunk, extremities, Zoryve cr qd, clobetasol sol qd, hair loss few yrs The patient is interested in treatment for hair loss.  The following portions of the chart were reviewed this encounter and updated as appropriate: medications, allergies, medical history  Review of Systems:  No other skin or systemic complaints except as noted in HPI or Assessment and Plan.  Objective  Well appearing patient in no apparent distress; mood and affect are within normal limits.   A focused examination was performed of the following areas: Face, scalp, trunk, arms, legs  Relevant exam findings are noted in the Assessment and Plan.            Assessment & Plan  PSORIASIS Trunk, extremities, scalp, face Labs from 06/27/23, 08/30/23 viewed Exam: Well-demarcated erythematous papules/plaques with silvery scale, guttate pink scaly papules.  40% BSA.  Chronic and persistent condition with duration or expected duration over one year. Condition is symptomatic/ bothersome to patient. Not currently at goal.   patient denies joint pain  Psoriasis is a chronic non-curable, but treatable genetic/hereditary disease that may have other systemic features affecting other organ systems such as joints (Psoriatic Arthritis). It is associated with an increased risk of inflammatory bowel disease, heart disease, non-alcoholic fatty liver disease, and depression.  Treatments include light and laser treatments; topical medications; and systemic medications including oral and injectables.  Treatment Plan: Start Skyrizi 150mg /ml sq injections wk 0, wk 4, then q 12 wks Skyrizi 150/ml sq injection today to R ant thigh, sample x 1 NDC [id number], lot [id number], exp 08/2024  Advised pt to come back in a month for another sample if his prescription is still pending Cont Zoryve cr qd aa  psoriasis Cont Clobetasol sol to aa scalp qd prn flares, avoid f/g/a Cont Ketoconazole 2% shampoo 2x/wk, let sit 10 minutes and rinse out  Reviewed risks of biologics including immunosuppression, infections, injection site reaction, and failure to improve condition. Goal is control of skin condition, not cure.  Some older biologics such as Humira and Enbrel may slightly increase risk of malignancy and may worsen congestive heart failure.  Taltz and Cosentyx may cause inflammatory bowel disease to flare. The use of biologics requires long term medication management, including periodic office visits and monitoring of blood work.   Topical steroids (such as triamcinolone, fluocinolone, fluocinonide, mometasone, clobetasol, halobetasol, betamethasone, hydrocortisone) can cause thinning and lightening of the skin if they are used for too long in the same area. Your physician has selected the right strength medicine for your problem and area affected on the body. Please use your medication only as directed by your physician to prevent side effects.     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 Condition is symptomatic/ bothersome to patient. Not currently at goal.   Androgenetic Alopecia (or Male pattern hair loss) refers to the common patterned hair loss affecting many men.  Male pattern alopecia is mediated by dihydrotestosterone which induces miniaturization of androgen-sensitive hair follicles.  It is chronic and persistent, but treatable; not curable. Topical treatment includes: - 5% topical Minoxidil Oral treatment includes: - Finasteride 1 mg qd - Minoxidil 1.25 - 5 mg qd - Dutasteride 0.5 mg qd Adjunct therapy  includes: - Low Level Laser Light Therapy (LLLT) -  Platelet-rich Plasma injections (PRP) - Hair Transplantation or scalp reduction  Treatment Plan: Start Minoxidil 2.5mg  1 po qd   Doses of minoxidil for hair loss are considered 'low dose'. This is because the doses used for hair loss are much lower than the doses which are used for conditions such as high blood pressure (hypertension). The doses used for hypertension are 10-40mg  per day.  Side effects are uncommon at the low doses (up to 2.5 mg/day) used to treat hair loss. Potential side effects, more commonly seen at higher doses, include: Increase in hair growth (hypertrichosis) elsewhere on face and body Temporary hair shedding upon starting medication which may last up to 4 weeks Ankle swelling, fluid retention, rapid weight gain more than 5 pounds Low blood pressure and feeling lightheaded or dizzy when standing up quickly Fast or irregular heartbeat Headaches   Long term medication management.  Patient is using long term (months to years) prescription medication  to control their dermatologic condition.  These medications require periodic monitoring to evaluate for efficacy and side effects and may require periodic laboratory monitoring.  Psoriasis  Related Medications mometasone (ELOCON) 0.1 % cream APPLY 1 APPLICATION TOPICALLY DAILY AS NEEDED TO AFFECTED AREA (RASH)ON THE FOREHEAD AND SCALP DAILY - TWICE DAILY AS NEEDED FOR UP TO 5 DAYS PER WEEK.  triamcinolone cream (KENALOG) 0.1 % Apply to aa's rash on the body and arms QD-BID PRN up to 5d/wk. Avoid applying to face, groin, and axilla. Use as directed. Long-term use can cause thinning of the skin.  risankizumab-rzaa (SKYRIZI) 150 MG/ML pen Inject unde the skin every 12 weeks thereafter  risankizumab-rzaa (SKYRIZI) 150 MG/ML pen 150 mg subQ at Week 0 and Week 4  clobetasol (TEMOVATE) 0.05 % external solution Apply 1 Application topically 2 (two) times daily. To scalp as needed. Avoid applying to face, groin, and axilla. Use  as directed. Long-term use can cause thinning of the skin.  risankizumab-rzaa [facility]) pen 150 mg     Return in about 4 months (around 01/06/2024) for Psoriasis f/u.  I, Ardis Rowan, RMA, am acting as scribe for Armida Sans, MD .   Documentation: I have reviewed the above documentation for accuracy and completeness, and I agree with the above.  Armida Sans, MD

## 2023-09-20 ENCOUNTER — Ambulatory Visit: Payer: Medicaid Other | Admitting: Dermatology

## 2023-11-10 ENCOUNTER — Ambulatory Visit
Admission: EM | Admit: 2023-11-10 | Discharge: 2023-11-10 | Disposition: A | Discharge status: Home / Self Care | Payer: Medicaid Other | Attending: Emergency Medicine | Admitting: Emergency Medicine

## 2023-11-10 DIAGNOSIS — H66002 Acute suppurative otitis media without spontaneous rupture of ear drum, left ear: Secondary | ICD-10-CM

## 2023-11-10 DIAGNOSIS — J209 Acute bronchitis, unspecified: Secondary | ICD-10-CM

## 2023-11-10 LAB — POCT INFLUENZA A/B
Influenza A, POC: NEGATIVE
Influenza B, POC: NEGATIVE

## 2023-11-10 MED ORDER — CEFDINIR 300 MG PO CAPS: 300.0000 mg | ORAL_CAPSULE | Freq: Two times a day (BID) | ORAL | 0 refills | Status: AC

## 2023-11-10 MED ORDER — GUAIFENESIN 400 MG PO TABS
ORAL_TABLET | ORAL | 0 refills | Status: DC
Start: 2023-11-10 — End: 2024-04-21

## 2023-11-10 MED ORDER — PROMETHAZINE-DM 6.25-15 MG/5ML PO SYRP: 5.0000 mL | ORAL_SOLUTION | Freq: Every evening | ORAL | 0 refills | Status: DC | PRN

## 2023-11-10 NOTE — ED Triage Notes (Addendum)
Patient presents with shortness of breathe, coughing (green and brown mucus) decreased appetite, chills, sneezing and headache x day 3. No treatment used.

## 2023-11-10 NOTE — ED Provider Notes (Signed)
[name]    CSN: [id number] Arrival date & time: 11/10/23  1236    HISTORY   Chief Complaint  Patient presents with   Headache   HPI Calvin Fuller is a pleasant, 22 y.o. male who presents to urgent care today. Patient complains of a 3-day history of cough productive of greenish, brownish mucus, nasal congestion, purulent nasal drainage similar to the mucus he is coughing up, sneezing, headache, shortness of breath, body aches.  Patient denies fever, chills, nausea, vomiting, diarrhea.  Patient states he visited a friend a few days ago had similar symptoms but does not know whether or not the friend tested positive for COVID-19 or influenza.  The history is provided by the patient.   History reviewed. No pertinent past medical history. Patient Active Problem List   Diagnosis Date Noted   History of repair of ACL 12/14/2017   Mild concussion 08/14/2013   Past Surgical History:  Procedure Laterality Date   KNEE ARTHROSCOPY W/ ACL RECONSTRUCTION Left     Home Medications    Prior to Admission medications   Medication Sig Start Date End Date Taking? Authorizing Provider  Apremilast (OTEZLA) 30 MG TABS Take 1 tablet (30 mg total) by mouth 2 (two) times daily. Patient not taking: Reported on 07/13/2023 06/06/23   [name], MD  azithromycin (ZITHROMAX) 500 MG tablet Take 500 mg by mouth daily. 12/04/20   [provider]  carboxymethylcellulose ([employer] LUBRICANT EYE DROPS) 0.5 % SOLN Place 1 drop into the right eye 3 (three) times daily as needed. Patient not taking: Reported on 07/13/2023 06/27/23   [name], MD  clobetasol (TEMOVATE) 0.05 % external solution Apply 1 Application topically 2 (two) times daily. To scalp as needed. Avoid applying to face, groin, and axilla. Use as directed. Long-term use can cause thinning of the skin. 07/13/23   [name], MD  doxycycline (VIBRA-TABS) 100 MG tablet Take 100 mg by mouth 2 (two) times daily.  11/04/20   [provider]  erythromycin ophthalmic ointment Place a 1/2 inch ribbon of ointment into the lower eyelid four times daily for 7 days Patient not taking: Reported on 07/13/2023 06/27/23   [name], MD  hydrOXYzine (ATARAX) 25 MG tablet Take 1 tablet (25 mg total) by mouth every 6 (six) hours. Patient not taking: Reported on 01/16/2023 12/29/22   [name], PA-C  ketoconazole (NIZORAL) 2 % shampoo Apply 1 Application topically 2 (two) times a week. Wash scalp 2 times weekly, let sit 10 minutes and wash out 09/07/23   [name], MD  loratadine (CLARITIN) 10 MG tablet Take 1 tablet (10 mg total) by mouth at bedtime. Patient not taking: Reported on 01/16/2023 12/29/22   [name], PA-C  loratadine (CLARITIN) 10 MG tablet Take by mouth daily.    [provider]  minoxidil (LONITEN) 2.5 MG tablet Take 1 tablet (2.5 mg total) by mouth daily. 09/05/23   [name], MD  mometasone (ELOCON) 0.1 % cream APPLY 1 APPLICATION TOPICALLY DAILY AS NEEDED TO AFFECTED AREA (RASH)ON THE FOREHEAD AND SCALP DAILY - TWICE DAILY AS NEEDED FOR UP TO 5 DAYS PER WEEK. Patient not taking: Reported on 07/13/2023 02/07/23   [name], MD  mometasone (ELOCON) 0.1 % cream Apply 1 Application topically daily as needed (Rash). Patient not taking: Reported on 07/13/2023 06/27/23   [name], MD  risankizumab-rzaa [facility]) 150 MG/ML pen Inject unde the skin every 12 weeks thereafter 07/13/23  [name], MD  [name]) 150 MG/ML pen 150 mg subQ at Week 0 and Week 4 07/13/23   [name], MD  Roflumilast (ZORYVE) 0.3 % CREA Apply 1 Application topically daily. 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 Avoid applying to face, groin, and axilla. Use as directed. Long-term use can cause thinning of the skin. 07/11/23   [name], MD  valACYclovir (VALTREX) 1000 MG tablet Take 1 tablet (1,000 mg total) by mouth 3 (three) times daily. Patient not taking: Reported on 01/16/2023 12/29/22   [name], PA-C    Family History Family History  Family history unknown: Yes   Social History Social History   Tobacco Use   Smoking status: Never   Smokeless tobacco: Never  Vaping Use   Vaping status: Every Day   Substances: Nicotine, Flavoring  Substance Use Topics   Alcohol use: No   Drug use: No   Allergies   Patient has no known allergies.  Review of Systems Review of Systems Pertinent findings revealed after performing a 14 point review of systems has been noted in the history of present illness.  Physical Exam Vital Signs BP 106/72 (BP Location: Left Arm)   Pulse 66   Temp 98.5 F (36.9 C) (Oral)   Resp 20   Ht 5\' 7"  (1.702 m)   Wt 150 lb (68 kg)   SpO2 97%   BMI 23.49 kg/m   No data found.  Physical Exam Vitals and nursing note reviewed.  Constitutional:      General: He is awake.     Appearance: Normal appearance. He is well-developed and well-groomed. He is ill-appearing.  HENT:     Head: Normocephalic and atraumatic.     Salivary Glands: Right salivary gland is not diffusely enlarged or tender. Left salivary gland is not diffusely enlarged or tender.     Right Ear: Hearing and external ear normal. A middle ear effusion (Serous) is present. Tympanic membrane is injected.     Left Ear: Hearing and external ear normal. A middle ear effusion (Suppurative) is present. Tympanic membrane is injected and erythematous.     Ears:     Comments: Bilateral EACs with erythema and mild edema    Nose: Congestion and rhinorrhea present. Rhinorrhea is clear.     Right Sinus: No maxillary sinus tenderness or frontal sinus tenderness.     Left Sinus: No maxillary sinus tenderness.     Mouth/Throat:     Mouth: Mucous membranes are moist.     Pharynx: Pharyngeal swelling, posterior oropharyngeal  erythema and uvula swelling present.     Tonsils: No tonsillar exudate. 0 on the right. 0 on the left.  Eyes:     General: Lids are normal.     Pupils: Pupils are equal, round, and reactive to light.  Cardiovascular:     Rate and Rhythm: Normal rate and regular rhythm.     Pulses: Normal pulses.  Pulmonary:     Effort: Pulmonary effort is normal. No tachypnea, bradypnea, accessory muscle usage, prolonged expiration or respiratory distress.     Breath sounds: No stridor. No wheezing, rhonchi or rales.     Comments: Course breath sounds throughout without wheeze, rale, rhonchi. Abdominal:     General: Abdomen is flat. Bowel sounds are normal.     Palpations: Abdomen is soft.  Musculoskeletal:        General: Normal range of motion.     Cervical back: Full passive range of motion without pain, normal range of motion and neck supple.  Lymphadenopathy:     Cervical: Cervical adenopathy present.     Right cervical: Superficial cervical adenopathy and posterior cervical adenopathy present.     Left cervical: Superficial cervical adenopathy and posterior cervical adenopathy present.  Skin:    General: Skin is warm and dry.  Neurological:     General: No focal deficit present.     Mental Status: He is alert and oriented to person, place, and time.     Motor: Motor function is intact.     Coordination: Coordination is intact.     Gait: Gait is intact.     Deep Tendon Reflexes: Reflexes are normal and symmetric.  Psychiatric:        Attention and Perception: Attention and perception normal.        Mood and Affect: Mood and affect normal.        Speech: Speech normal.        Behavior: Behavior normal. Behavior is cooperative.        Thought Content: Thought content normal.    Visual Acuity Right Eye Distance:   Left Eye Distance:   Bilateral Distance:    Right Eye Near:   Left Eye Near:    Bilateral Near:     UC Couse / Diagnostics / Procedures:     Radiology No results  found.  Procedures Procedures (including critical care time) EKG  Pending results:  Labs Reviewed  POCT INFLUENZA A/B - Normal    Medications Ordered in UC: Medications - No data to display  UC Diagnoses / Final Clinical Impressions(s)   I have reviewed the triage vital signs and the nursing notes.  Pertinent labs & imaging results that were available during my care of the patient were reviewed by me and considered in my medical decision making (see chart for details).    Final diagnoses:  Acute suppurative otitis media of left ear without spontaneous rupture of tympanic membrane, recurrence not specified  Acute bronchitis, unspecified organism   Influenza test was negative, patient notified.  Do not believe the patient is experiencing COVID-19 based on physical exam findings today.  Patient will be treated empirically for presumed bacterial infection in his left inner ear with a 10-day course of cefdinir.  Recommend guaifenesin for daytime cough, Promethazine DM for nighttime cough and ibuprofen, all to address physical exam findings concerning for bronchitis.  Conservative care recommended.  Return precautions advised.  Please see discharge instructions below for details of plan of care as provided to patient. ED Prescriptions     Medication Sig Dispense Auth. Provider   cefdinir (OMNICEF) 300 MG capsule Take 1 capsule (300 mg total) by mouth 2 (two) times daily for 10 days. 20 capsule Theadora Rama Scales, PA-C   guaifenesin (HUMIBID E) 400 MG TABS tablet Take 1 tablet 3 times daily as needed for chest congestion and cough 30 tablet Theadora Rama Scales, PA-C   promethazine-dextromethorphan (PROMETHAZINE-DM) 6.25-15 MG/5ML syrup Take 5 mLs by mouth at bedtime as needed for cough. 60 mL Theadora Rama Scales, PA-C      PDMP not reviewed this encounter.  Pending results:  Labs Reviewed  POCT INFLUENZA A/B - Normal      Discharge Instructions      Your rapid  influenza antigen test today was negative.  No further  influenza testing is indicated.  Based on physical exam findings, I have a low suspicion that you have COVID-19.  I do not believe that testing for COVID-19 would be of any benefit.   Please read below to learn more about the medications, dosages and frequencies that I recommend to help alleviate your symptoms and to get you feeling better soon:   Omnicef (cefdinir):  Please take one (1) dose twice daily for 10 days.  This antibiotic can cause upset stomach, this will resolve once antibiotics are complete.  You are welcome to take a probiotic, eat yogurt, take Imodium while taking this medication.  Please avoid other systemic medications such as Maalox, Pepto-Bismol or milk of magnesia as they can interfere with the body's ability to absorb the antibiotics.  Advil, Motrin (ibuprofen): This is a good anti-inflammatory medication which addresses aches, pains and inflammation of the upper airways that causes sinus and nasal congestion as well as in the lower airways which makes your cough feel tight and sometimes burn.  I recommend that you take between 400 to 600 mg every 6-8 hours as needed.  Please do not take more than 2400 mg of ibuprofen in a 24-hour period and please do not take high doses of ibuprofen for more than 3 days in a row as this can lead to stomach ulcers.   Robitussin, Mucinex (guaifenesin): This is an expectorant.  This single symptom reliever helps break up chest congestion and loosen up thick nasal drainage making phlegm and drainage easier to cough up and to blow out from your nose.  I recommend taking 400 mg in either liquid or tablet form three times daily as needed.  I do not recommend the 12-hour extended relief version or doses higher than 400 mg per each dose as these often make some patients feel jittery or jumpy and can interfere with sleep.  I also do not recommend that you purchase guaifenesin with the ingredient " DM" which  is dextromethorphan, a cough suppressant and that you plan on taking it at bedtime.  Guaifenesin 400 mg is a safe dose for people who are being treated for high blood pressure.  This medication is available over-the-counter.   Promethazine DM: Promethazine is both a nasal decongestant that dries up mucous membranes and an antinausea medication.  Promethazine often makes most patients feel fairly sleepy.  "DM" is dextromethorphan, a single symptom reliever which is a cough suppressant found in many over-the-counter cough medications and combination cold preparations.  Please take 5 mL before bedtime to minimize your cough which will help you sleep better.  I have sent a prescription for this medication to your pharmacy because it cannot be purchased over-the-counter.   If symptoms have not meaningfully improved in the next 5 to 7 days, please return for repeat evaluation or follow-up with your regular provider.  If symptoms have worsened in the next 3 to 5 days, please return for repeat evaluation or follow-up with your regular provider.    Thank you for visiting urgent care today.  We appreciate the opportunity to participate in your care.       Disposition Upon Discharge:  Condition: stable for discharge home  Patient presented with an acute illness with associated systemic symptoms and significant discomfort requiring urgent management. In my opinion, this is a condition that a prudent lay person (someone who possesses an average knowledge of health and medicine) may potentially expect to result in complications if not addressed urgently such as respiratory distress,  impairment of bodily function or dysfunction of bodily organs.   Routine symptom specific, illness specific and/or disease specific instructions were discussed with the patient and/or caregiver at length.   As such, the patient has been evaluated and assessed, work-up was performed and treatment was provided in alignment with  urgent care protocols and evidence based medicine.  Patient/parent/caregiver has been advised that the patient may require follow up for further testing and treatment if the symptoms continue in spite of treatment, as clinically indicated and appropriate.  Patient/parent/caregiver has been advised to return to the [facility] or PCP if no better; to PCP or the Emergency Department if new signs and symptoms develop, or if the current signs or symptoms continue to change or worsen for further workup, evaluation and treatment as clinically indicated and appropriate  The patient will follow up with their current PCP if and as advised. If the patient does not currently have a PCP we will assist them in obtaining one.   The patient may need specialty follow up if the symptoms continue, in spite of conservative treatment and management, for further workup, evaluation, consultation and treatment as clinically indicated and appropriate.  Patient/parent/caregiver verbalized understanding and agreement of plan as discussed.  All questions were addressed during visit.  Please see discharge instructions below for further details of plan.  This office note has been dictated using Teaching laboratory technician.  Unfortunately, this method of dictation can sometimes lead to typographical or grammatical errors.  I apologize for your inconvenience in advance if this occurs.  Please do not hesitate to reach out to me if clarification is needed.      Theadora Rama Scales, PA-C 11/10/23 1414

## 2023-11-10 NOTE — Discharge Instructions (Signed)
Your rapid influenza antigen test today was negative.  No further influenza testing is indicated.  Based on physical exam findings, I have a low suspicion that you have COVID-19.  I do not believe that testing for COVID-19 would be of any benefit.   Please read below to learn more about the medications, dosages and frequencies that I recommend to help alleviate your symptoms and to get you feeling better soon:   Omnicef (cefdinir):  Please take one (1) dose twice daily for 10 days.  This antibiotic can cause upset stomach, this will resolve once antibiotics are complete.  You are welcome to take a probiotic, eat yogurt, take Imodium while taking this medication.  Please avoid other systemic medications such as Maalox, Pepto-Bismol or milk of magnesia as they can interfere with the body's ability to absorb the antibiotics.  Advil, Motrin (ibuprofen): This is a good anti-inflammatory medication which addresses aches, pains and inflammation of the upper airways that causes sinus and nasal congestion as well as in the lower airways which makes your cough feel tight and sometimes burn.  I recommend that you take between 400 to 600 mg every 6-8 hours as needed.  Please do not take more than 2400 mg of ibuprofen in a 24-hour period and please do not take high doses of ibuprofen for more than 3 days in a row as this can lead to stomach ulcers.   Robitussin, Mucinex (guaifenesin): This is an expectorant.  This single symptom reliever helps break up chest congestion and loosen up thick nasal drainage making phlegm and drainage easier to cough up and to blow out from your nose.  I recommend taking 400 mg in either liquid or tablet form three times daily as needed.  I do not recommend the 12-hour extended relief version or doses higher than 400 mg per each dose as these often make some patients feel jittery or jumpy and can interfere with sleep.  I also do not recommend that you purchase guaifenesin with the ingredient  " DM" which is dextromethorphan, a cough suppressant and that you plan on taking it at bedtime.  Guaifenesin 400 mg is a safe dose for people who are being treated for high blood pressure.  This medication is available over-the-counter.   Promethazine DM: Promethazine is both a nasal decongestant that dries up mucous membranes and an antinausea medication.  Promethazine often makes most patients feel fairly sleepy.  "DM" is dextromethorphan, a single symptom reliever which is a cough suppressant found in many over-the-counter cough medications and combination cold preparations.  Please take 5 mL before bedtime to minimize your cough which will help you sleep better.  I have sent a prescription for this medication to your pharmacy because it cannot be purchased over-the-counter.   If symptoms have not meaningfully improved in the next 5 to 7 days, please return for repeat evaluation or follow-up with your regular provider.  If symptoms have worsened in the next 3 to 5 days, please return for repeat evaluation or follow-up with your regular provider.    Thank you for visiting urgent care today.  We appreciate the opportunity to participate in your care.

## 2024-01-18 ENCOUNTER — Ambulatory Visit: Payer: Medicaid Other | Admitting: Dermatology

## 2024-02-26 ENCOUNTER — Ambulatory Visit
Admission: RE | Admit: 2024-02-26 | Discharge: 2024-02-26 | Disposition: A | Discharge status: Home / Self Care | Source: Ambulatory Visit | Attending: Emergency Medicine | Admitting: Emergency Medicine

## 2024-02-26 VITALS — BP 150/74 | HR 71 | Temp 97.3°F | Resp 16 | Wt 155.0 lb

## 2024-02-26 DIAGNOSIS — R809 Proteinuria, unspecified: Secondary | ICD-10-CM | POA: Diagnosis present

## 2024-02-26 DIAGNOSIS — Z113 Encounter for screening for infections with a predominantly sexual mode of transmission: Secondary | ICD-10-CM

## 2024-02-26 DIAGNOSIS — R1030 Lower abdominal pain, unspecified: Secondary | ICD-10-CM | POA: Diagnosis present

## 2024-02-26 LAB — POCT URINALYSIS DIP (MANUAL ENTRY)
Bilirubin, UA: NEGATIVE
Blood, UA: NEGATIVE
Glucose, UA: NEGATIVE mg/dL
Leukocytes, UA: NEGATIVE
Nitrite, UA: NEGATIVE
Protein Ur, POC: 30 mg/dL — AB
Spec Grav, UA: >=1.03 — AB (ref 1.010–1.025)
Urobilinogen, UA: 0.2 U/dL
pH, UA: 6.5 (ref 5.0–8.0)

## 2024-02-26 NOTE — ED Triage Notes (Signed)
 Pt presents with penis discomfort and lower abdominal pain x 1 week. Pt denies emesis. Last sexual encounter x 1 week ago.

## 2024-02-26 NOTE — ED Provider Notes (Signed)
 [name]    CSN: [id number] Arrival date & time: 02/26/24  1318      History   Chief Complaint Chief Complaint  Patient presents with   Exposure to STD    Entered by patient    HPI Calvin Fuller is a 23 y.o. male.   23 year old male patient, Calvin Fuller, presents to urgent care for evaluation of ? penis discomfort and lower abdominal pain for 1 week.  Patient denies any fever, dysuria,  nausea, vomiting or diarrhea.  Patient states his last sexual encounter was 1 week ago: new partner.  Patient denies any penile lesions, discharge or additional concerns at this time.  The history is provided by the patient. No language interpreter was used.    History reviewed. No pertinent past medical history.  Patient Active Problem List   Diagnosis Date Noted   Proteinuria 02/26/2024   Lower abdominal pain 02/26/2024   Routine screening for STI (sexually transmitted infection) 02/26/2024   History of repair of ACL 12/14/2017   Mild concussion 08/14/2013    Past Surgical History:  Procedure Laterality Date   KNEE ARTHROSCOPY W/ ACL RECONSTRUCTION Left        Home Medications    Prior to Admission medications   Medication Sig Start Date End Date Taking? Authorizing Provider  clobetasol (TEMOVATE) 0.05 % external solution Apply 1 Application topically 2 (two) times daily. To scalp as needed. Avoid applying to face, groin, and axilla. Use as directed. Long-term use can cause thinning of the skin. 07/13/23   [name], MD  guaifenesin (HUMIBID E) 400 MG TABS tablet Take 1 tablet 3 times daily as needed for chest congestion and cough 11/10/23   Theadora Rama Scales, PA-C  ketoconazole (NIZORAL) 2 % shampoo Apply 1 Application topically 2 (two) times a week. Wash scalp 2 times weekly, let sit 10 minutes and wash out 09/07/23   [name], MD  loratadine (CLARITIN) 10 MG tablet Take by mouth daily.    [provider]  minoxidil  (LONITEN) 2.5 MG tablet Take 1 tablet (2.5 mg total) by mouth daily. 09/05/23   [name], MD  promethazine-dextromethorphan (PROMETHAZINE-DM) 6.25-15 MG/5ML syrup Take 5 mLs by mouth at bedtime as needed for cough. 11/10/23   Theadora Rama Scales, PA-C  risankizumab-rzaa [facility]) 150 MG/ML pen Inject unde the skin every 12 weeks thereafter 07/13/23   [name], MD  risankizumab-rzaa [facility]) 150 MG/ML pen 150 mg subQ at Week 0 and Week 4 07/13/23   [name], MD  Roflumilast (ZORYVE) 0.3 % CREA Apply 1 Application topically daily. 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 Avoid applying to face, groin, and axilla. Use as directed. Long-term use can cause thinning of the skin. 07/11/23   [name], MD    Family History History reviewed. No pertinent family history.   Social History Social History   Tobacco Use   Smoking status: Never    Passive exposure: Current   Smokeless tobacco: Never  Vaping Use   Vaping status: Every Day   Substances: Nicotine, Flavoring  Substance Use Topics   Alcohol use: No   Drug use: No     Allergies   Patient has no known allergies.   Review of Systems Review of Systems  Constitutional:  Negative for fever.  Gastrointestinal:  Positive for  abdominal pain. Negative for diarrhea, nausea and vomiting.  Genitourinary:  Negative for dysuria, genital sores, penile discharge, penile pain, penile swelling, scrotal swelling and testicular pain.  Musculoskeletal:  Negative for back pain.  All other systems reviewed and are negative.    Physical Exam Triage Vital Signs ED Triage Vitals  Encounter Vitals Group     BP      Systolic BP Percentile      Diastolic BP Percentile      Pulse      Resp      Temp      Temp src      SpO2      Weight      Height      Head Circumference      Peak Flow       Pain Score      Pain Loc      Pain Education      Exclude from Growth Chart    No data found.  Updated Vital Signs BP (!) 150/74 (BP Location: Left Arm)   Pulse 71   Temp (!) 97.3 F (36.3 C) (Oral)   Resp 16   Wt 155 lb (70.3 kg)   SpO2 96%   BMI 24.28 kg/m   Visual Acuity Right Eye Distance:   Left Eye Distance:   Bilateral Distance:    Right Eye Near:   Left Eye Near:    Bilateral Near:     Physical Exam Vitals and nursing note reviewed.  Constitutional:      General: He is not in acute distress.    Appearance: He is well-developed and well-groomed.  HENT:     Head: Normocephalic and atraumatic.  Eyes:     Conjunctiva/sclera: Conjunctivae normal.  Cardiovascular:     Rate and Rhythm: Normal rate.     Heart sounds: No murmur heard. Pulmonary:     Effort: Pulmonary effort is normal. No respiratory distress.  Abdominal:     General: Bowel sounds are normal.     Palpations: Abdomen is soft.     Tenderness: There is no abdominal tenderness. There is no guarding or rebound.  Musculoskeletal:        General: No swelling.     Cervical back: Neck supple.  Skin:    General: Skin is warm and dry.     Capillary Refill: Capillary refill takes less than 2 seconds.  Neurological:     General: No focal deficit present.     Mental Status: He is alert and oriented to person, place, and time.     GCS: GCS eye subscore is 4. GCS verbal subscore is 5. GCS motor subscore is 6.  Psychiatric:        Attention and Perception: Attention normal.        Mood and Affect: Mood normal.        Speech: Speech normal.        Behavior: Behavior normal. Behavior is cooperative.      UC Treatments / Results  Labs (all labs ordered are listed, but only abnormal results are displayed) Labs Reviewed  POCT URINALYSIS DIP (MANUAL ENTRY) - Abnormal; Notable for the following components:      Result Value   Ketones, POC UA trace (5) (*)    Spec Grav, UA >=1.030 (*)    Protein Ur, POC  =30 (*)    All other components within normal limits  CYTOLOGY, (ORAL, ANAL, URETHRAL) ANCILLARY ONLY    EKG  Radiology No results found.  Procedures Procedures (including critical care time)  Medications Ordered in UC Medications - No data to display  Initial Impression / Assessment and Plan / UC Course  I have reviewed the triage vital signs and the nursing notes.  Pertinent labs & imaging results that were available during my care of the patient were reviewed by me and considered in my medical decision making (see chart for details).  Clinical Course as of 02/26/24 1447  Mon Feb 26, 2024  1358 Ua is negative for UTI: protein 30, trace ketones, no bacteria or nitrates, will await cytology results before treating for possible std, pt aware, will check my chart, safe sex practice  [JD]    Clinical Course User Index [JD] Nikola Marone, Para March, NP  Discussed exam findings and plan of care with patient, strict go to ER precautions given.   Patient verbalized understanding to this provider.   Ddx: Abdominal pain, Proteinuria, STI,UTI Final Clinical Impressions(s) / UC Diagnoses   Final diagnoses:  Proteinuria, unspecified type  Lower abdominal pain  Routine screening for STI (sexually transmitted infection)     Discharge Instructions      Your urine was negative for uti, shows some proteinuria-follow up with PCP for recheck of UA and bp recheck as it was elevated in office today Check my chart for results.  Avoid sexual activity until results,treatment known and completed.  Safe sex with all future sexual activity.  We have sent testing for sexually transmitted infections.  We will notify you of any positive results once they are received. If required, we will prescribe any medications you might need.  Follow up with PCP. Return as needed.     ED Prescriptions   None    PDMP not reviewed this encounter.   Clancy Gourd, NP 02/26/24 1447

## 2024-02-26 NOTE — Discharge Instructions (Addendum)
 Your urine was negative for uti, shows some proteinuria-follow up with PCP for recheck of UA and bp recheck as it was elevated in office today Check my chart for results.  Avoid sexual activity until results,treatment known and completed.  Safe sex with all future sexual activity.  We have sent testing for sexually transmitted infections.  We will notify you of any positive results once they are received. If required, we will prescribe any medications you might need.  Follow up with PCP. Return as needed.

## 2024-02-27 LAB — CYTOLOGY, (ORAL, ANAL, URETHRAL) ANCILLARY ONLY
Chlamydia: NEGATIVE
Comment: NEGATIVE
Comment: NEGATIVE
Comment: NORMAL
Neisseria Gonorrhea: NEGATIVE
Trichomonas: NEGATIVE

## 2024-03-12 ENCOUNTER — Encounter: Admitting: Family

## 2024-03-12 NOTE — Progress Notes (Signed)
 Erroneous encounter-disregard

## 2024-04-01 ENCOUNTER — Ambulatory Visit
Admission: RE | Admit: 2024-04-01 | Discharge: 2024-04-01 | Disposition: A | Discharge status: Home / Self Care | Source: Ambulatory Visit

## 2024-04-01 VITALS — BP 108/68 | HR 65 | Temp 98.4°F | Resp 18 | Ht 67.0 in | Wt 150.0 lb

## 2024-04-01 DIAGNOSIS — R140 Abdominal distension (gaseous): Secondary | ICD-10-CM | POA: Diagnosis not present

## 2024-04-01 NOTE — ED Triage Notes (Signed)
 blood specs while wippjng , swollen and redness on outside - Entered by patient

## 2024-04-01 NOTE — ED Provider Notes (Signed)
 [name]    CSN: [id number] Arrival date & time: 04/01/24  1358      History   Chief Complaint Chief Complaint  Patient presents with   Constipation    HPI Calvin Fuller is a 23 y.o. male.   Patient here today for evaluation of intermittent constipation, abdominal bloating and occasional blood spots when he wipes for the last 2 weeks.  He states that he has had bowel movements daily but sometimes does not feel as if he is completely emptying and will have to have another bowel movement later.  He denies any nausea or vomiting.  He does not report any other symptoms.  The history is provided by the patient.  Constipation Associated symptoms: no abdominal pain, no diarrhea, no fever, no nausea and no vomiting     History reviewed. No pertinent past medical history.  Patient Active Problem List   Diagnosis Date Noted   Proteinuria 02/26/2024   Lower abdominal pain 02/26/2024   Routine screening for STI (sexually transmitted infection) 02/26/2024   History of repair of ACL 12/14/2017   Mild concussion 08/14/2013    Past Surgical History:  Procedure Laterality Date   KNEE ARTHROSCOPY W/ ACL RECONSTRUCTION Left        Home Medications    Prior to Admission medications   Medication Sig Start Date End Date Taking? Authorizing Provider  risankizumab -rzaa (SKYRIZI ) 150 MG/ML pen Inject unde the skin every 12 weeks thereafter 07/13/23  Yes Harris Liming, MD  clobetasol  (TEMOVATE ) 0.05 % external solution Apply 1 Application topically 2 (two) times daily. To scalp as needed. Avoid applying to face, groin, and axilla. Use as directed. Long-term use can cause thinning of the skin. 07/13/23   Harris Liming, MD  guaifenesin  (HUMIBID E) 400 MG TABS tablet Take 1 tablet 3 times daily as needed for chest congestion and cough 11/10/23   Eloise Hake Scales, PA-C  ketoconazole  (NIZORAL ) 2 % shampoo Apply 1 Application topically 2 (two) times a week.  Wash scalp 2 times weekly, let sit 10 minutes and wash out 09/07/23   Elta Halter, MD  loratadine  (CLARITIN ) 10 MG tablet Take by mouth daily.    [provider]  minoxidil  (LONITEN ) 2.5 MG tablet Take 1 tablet (2.5 mg total) by mouth daily. 09/05/23   Elta Halter, MD  promethazine -dextromethorphan (PROMETHAZINE -DM) 6.25-15 MG/5ML syrup Take 5 mLs by mouth at bedtime as needed for cough. 11/10/23   Eloise Hake Scales, PA-C  risankizumab -rzaa (SKYRIZI ) 150 MG/ML pen 150 mg subQ at Week 0 and Week 4 07/13/23   Harris Liming, MD  Roflumilast  (ZORYVE ) 0.3 % CREA Apply 1 Application topically daily. 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 Avoid applying to face, groin, and axilla. Use as directed. Long-term use can cause thinning of the skin. 07/11/23   Elta Halter, MD    Family History History reviewed. No pertinent family history.  Social History Social History   Tobacco Use   Smoking status: Never    Passive exposure: Current   Smokeless tobacco: Never  Vaping Use   Vaping status: Every Day   Substances: Nicotine, Flavoring  Substance Use Topics   Alcohol use: No   Drug use: No     Allergies   Patient has no known allergies.   Review of Systems Review of Systems  Constitutional:  Negative for chills and fever.  Eyes:  Negative for discharge and redness.  Respiratory:  Negative for shortness of breath.   Gastrointestinal:  Positive for anal bleeding and constipation. Negative for abdominal distention, abdominal pain, blood in stool, diarrhea, nausea and vomiting.  Neurological:  Negative for numbness.     Physical Exam Triage Vital Signs ED Triage Vitals  Encounter Vitals Group     BP      Systolic BP Percentile      Diastolic BP Percentile      Pulse      Resp      Temp      Temp src      SpO2      Weight       Height      Head Circumference      Peak Flow      Pain Score      Pain Loc      Pain Education      Exclude from Growth Chart    No data found.  Updated Vital Signs BP 108/68 (BP Location: Right Arm)   Pulse 65   Temp 98.4 F (36.9 C) (Oral)   Resp 18   Ht 5\' 7"  (1.702 m)   Wt 150 lb (68 kg)   SpO2 96%   BMI 23.49 kg/m   Visual Acuity Right Eye Distance:   Left Eye Distance:   Bilateral Distance:    Right Eye Near:   Left Eye Near:    Bilateral Near:     Physical Exam Vitals and nursing note reviewed.  Constitutional:      General: He is not in acute distress.    Appearance: Normal appearance. He is not ill-appearing.  HENT:     Head: Normocephalic and atraumatic.  Eyes:     Conjunctiva/sclera: Conjunctivae normal.  Cardiovascular:     Rate and Rhythm: Normal rate.  Pulmonary:     Effort: Pulmonary effort is normal. No respiratory distress.  Neurological:     Mental Status: He is alert.  Psychiatric:        Mood and Affect: Mood normal.        Behavior: Behavior normal.        Thought Content: Thought content normal.      UC Treatments / Results  Labs (all labs ordered are listed, but only abnormal results are displayed) Labs Reviewed - No data to display  EKG   Radiology No results found.  Procedures Procedures (including critical care time)  Medications Ordered in UC Medications - No data to display  Initial Impression / Assessment and Plan / UC Course  I have reviewed the triage vital signs and the nursing notes.  Pertinent labs & imaging results that were available during my care of the patient were reviewed by me and considered in my medical decision making (see chart for details).    Recommended MiraLAX and probiotic to help with digestion.  Discussed that small spots of blood with wiping is likely due to anal fissure versus small hemorrhoid and recommended continued monitoring of symptoms with follow-up if no gradual improvement or  with any worsening symptoms.  Discussed he may need referral to GI if symptoms do not improve.  Final Clinical Impressions(s) / UC Diagnoses   Final diagnoses:  Abdominal bloating     Discharge Instructions        Try daily dose of miralax and probiotic to help with digestion.  ED Prescriptions   None    PDMP not reviewed this encounter.   Vernestine Gondola, PA-C 04/01/24 1538

## 2024-04-01 NOTE — Discharge Instructions (Signed)
   Try daily dose of miralax and probiotic to help with digestion.

## 2024-04-01 NOTE — ED Triage Notes (Signed)
"  Recently my BM's have been weird, a lot of constipation, longer bathroom sessions, my rectum looks inflamed, even some blood on tissue at times, all about 2 wks". No nausea. No vomiting.

## 2024-04-19 ENCOUNTER — Observation Stay (HOSPITAL_COMMUNITY)
Admission: EM | Admit: 2024-04-19 | Discharge: 2024-04-21 | Disposition: A | Discharge status: Home / Self Care | Attending: Internal Medicine | Admitting: Internal Medicine

## 2024-04-19 ENCOUNTER — Encounter (HOSPITAL_COMMUNITY): Payer: Self-pay

## 2024-04-19 ENCOUNTER — Emergency Department (HOSPITAL_COMMUNITY)

## 2024-04-19 ENCOUNTER — Other Ambulatory Visit: Payer: Self-pay

## 2024-04-19 DIAGNOSIS — R109 Unspecified abdominal pain: Secondary | ICD-10-CM | POA: Diagnosis present

## 2024-04-19 DIAGNOSIS — K561 Intussusception: Principal | ICD-10-CM | POA: Insufficient documentation

## 2024-04-19 LAB — CBC WITH DIFFERENTIAL/PLATELET
Abs Immature Granulocytes: 0 10*3/uL (ref 0.00–0.07)
Basophils Absolute: 0 10*3/uL (ref 0.0–0.1)
Basophils Relative: 1 %
Eosinophils Absolute: 0.1 10*3/uL (ref 0.0–0.5)
Eosinophils Relative: 2 %
HCT: 46.1 % (ref 39.0–52.0)
Hemoglobin: 15.6 g/dL (ref 13.0–17.0)
Immature Granulocytes: 0 %
Lymphocytes Relative: 44 %
Lymphs Abs: 1.5 10*3/uL (ref 0.7–4.0)
MCH: 29.7 pg (ref 26.0–34.0)
MCHC: 33.8 g/dL (ref 30.0–36.0)
MCV: 87.8 fL (ref 80.0–100.0)
Monocytes Absolute: 0.3 10*3/uL (ref 0.1–1.0)
Monocytes Relative: 8 %
Neutro Abs: 1.6 10*3/uL — ABNORMAL LOW (ref 1.7–7.7)
Neutrophils Relative %: 45 %
Platelets: 225 10*3/uL (ref 150–400)
RBC: 5.25 MIL/uL (ref 4.22–5.81)
RDW: 12.1 % (ref 11.5–15.5)
WBC: 3.5 10*3/uL — ABNORMAL LOW (ref 4.0–10.5)
nRBC: 0 % (ref 0.0–0.2)

## 2024-04-19 LAB — LIPASE, BLOOD: Lipase: 31 U/L (ref 11–51)

## 2024-04-19 LAB — URINALYSIS, ROUTINE W REFLEX MICROSCOPIC
Bacteria, UA: NONE SEEN
Bilirubin Urine: NEGATIVE
Glucose, UA: NEGATIVE mg/dL
Hgb urine dipstick: NEGATIVE
Ketones, ur: NEGATIVE mg/dL
Leukocytes,Ua: NEGATIVE
Nitrite: NEGATIVE
Protein, ur: NEGATIVE mg/dL
Specific Gravity, Urine: 1.018 (ref 1.005–1.030)
pH: 6 (ref 5.0–8.0)

## 2024-04-19 LAB — COMPREHENSIVE METABOLIC PANEL WITH GFR
ALT: 16 U/L (ref 0–44)
AST: 22 U/L (ref 15–41)
Albumin: 4.1 g/dL (ref 3.5–5.0)
Alkaline Phosphatase: 45 U/L (ref 38–126)
Anion gap: 7 (ref 5–15)
BUN: 12 mg/dL (ref 6–20)
CO2: 24 mmol/L (ref 22–32)
Calcium: 9.4 mg/dL (ref 8.9–10.3)
Chloride: 107 mmol/L (ref 98–111)
Creatinine, Ser: 1.14 mg/dL (ref 0.61–1.24)
GFR, Estimated: >60 mL/min (ref >60)
Glucose, Bld: 88 mg/dL (ref 70–99)
Potassium: 4.2 mmol/L (ref 3.5–5.1)
Sodium: 138 mmol/L (ref 135–145)
Total Bilirubin: 0.7 mg/dL (ref 0.0–1.2)
Total Protein: 6.9 g/dL (ref 6.5–8.1)

## 2024-04-19 MED ORDER — IOHEXOL 9 MG/ML PO SOLN
500.0000 mL | ORAL | Status: DC
  Administered 2024-04-20: 500 mL via ORAL

## 2024-04-19 MED ORDER — IOHEXOL 9 MG/ML PO SOLN
500.0000 mL | ORAL | Status: AC
  Administered 2024-04-20: 500 mL via ORAL

## 2024-04-19 MED ORDER — IOHEXOL 350 MG/ML SOLN
75.0000 mL | Freq: Once | INTRAVENOUS | Status: AC | PRN
  Administered 2024-04-19: 75 mL via INTRAVENOUS

## 2024-04-19 MED ORDER — KETOROLAC TROMETHAMINE 15 MG/ML IJ SOLN
15.0000 mg | Freq: Once | INTRAMUSCULAR | Status: AC
  Administered 2024-04-19: 15 mg via INTRAVENOUS
  Filled 2024-04-19: qty 1

## 2024-04-19 MED ORDER — IOHEXOL 9 MG/ML PO SOLN
ORAL | Status: AC
  Filled 2024-04-19: qty 500

## 2024-04-19 MED ORDER — IOHEXOL 9 MG/ML PO SOLN: 500.0000 mL | ORAL | Status: DC

## 2024-04-19 NOTE — ED Triage Notes (Signed)
 Pt c/o a "weird feeling in my stomach," abdominal pain that worsens with sudden movement and constipation x 2 weeks. Pt denies nausea, vomiting, or diarrhea. Pt states he has had blood in stool as well.

## 2024-04-19 NOTE — ED Provider Triage Note (Signed)
 Emergency Medicine Provider Triage Evaluation Note  Calvin Fuller , a 23 y.o. male  was evaluated in triage.  Pt complains of 2 weeks of generalized abdominal discomfort.  Patient is initially had bright red blood per rectum however this has since stopped.  Patient is pain is worse with movement or when he tries to do an ab crunch.  Patient has any constipation diarrhea hematochezia melena chest pain shortness of breath nausea vomiting fevers.  Review of Systems  Positive:  Negative:   Physical Exam  BP (!) 121/95 (BP Location: Right Arm)   Pulse 66   Temp 97.8 F (36.6 C) (Oral)   Resp 16   Ht 5\' 7"  (1.702 m)   Wt 65.8 kg   SpO2 98%   BMI 22.71 kg/m  Gen:   Awake, no distress   Resp:  Normal effort  MSK:   Moves extremities without difficulty  Other:  No abdominal tenderness or peritoneal signs  Medical Decision Making  Medically screening exam initiated at 2:29 PM.  Appropriate orders placed.  Calvin Fuller was informed that the remainder of the evaluation will be completed by another provider, this initial triage assessment does not replace that evaluation, and the importance of remaining in the ED until their evaluation is complete.  Workup initiated patient stable.   Denese Finn, PA-C 04/19/24 1430

## 2024-04-19 NOTE — Consult Note (Signed)
 Reason for Consult/Chief Complaint: intussusception Consultant: Linder Revere, MD  Calvin Fuller is an 23 y.o. male.   HPI: 23M with two week history of diffuse sharp "slicing" abdominal pain and constipation. Today the pain was "different" and describes as flexing ab muscles bilaterally. Last BM 5/22. Reports BMs have been small in quantity and frequent/incomplete emptying with small specks of blood on the toilet. Has not taken pain medications.   No family history of Crohns, UC, or colon cancer. No personal history of the same. No prior colonoscopy.   No tobacco, occasional EtOH, +MJ. 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 No pertinent past medical history.  Past Surgical History:  Procedure Laterality Date   KNEE ARTHROSCOPY W/ ACL RECONSTRUCTION Left     History reviewed. No pertinent family history.  Social History:  reports that he has never smoked. He has been exposed to tobacco smoke. He has never used smokeless tobacco. He reports that he does not drink alcohol and does not use drugs.  Allergies: No Known Allergies  Medications: I have reviewed the patient's current medications.  Results for orders placed or performed during the hospital encounter of 04/19/24 (from the past 48 hours)  Urinalysis, Routine w reflex microscopic -Urine, Clean Catch     Status: None   Collection Time: 04/19/24  2:30 PM  Result Value Ref Range   Color, Urine YELLOW YELLOW   APPearance CLEAR CLEAR   Specific Gravity, Urine 1.018 1.005 - 1.030   pH 6.0 5.0 - 8.0   Glucose, UA NEGATIVE NEGATIVE mg/dL   Hgb urine dipstick NEGATIVE NEGATIVE   Bilirubin Urine NEGATIVE NEGATIVE   Ketones, ur NEGATIVE NEGATIVE mg/dL   Protein, ur NEGATIVE NEGATIVE mg/dL   Nitrite NEGATIVE NEGATIVE   Leukocytes,Ua NEGATIVE NEGATIVE   RBC / HPF 0-5 0 - 5 RBC/hpf   WBC, UA 0-5 0 - 5 WBC/hpf   Bacteria, UA NONE SEEN NONE SEEN   Squamous Epithelial / HPF 0-5 0 - 5 /HPF   Mucus PRESENT      Comment: Performed at [facility], [address], [place], [place] [address]  CBC with Differential     Status: Abnormal   Collection Time: 04/19/24  2:32 PM  Result Value Ref Range   WBC 3.5 (L) 4.0 - 10.5 K/uL   RBC 5.25 4.22 - 5.81 MIL/uL   Hemoglobin 15.6 13.0 - 17.0 g/dL   HCT 08.6 57.8 - 46.9 %   MCV 87.8 80.0 - 100.0 fL   MCH 29.7 26.0 - 34.0 pg   MCHC 33.8 30.0 - 36.0 g/dL   RDW 62.9 52.8 - 41.3 %   Platelets 225 150 - 400 K/uL   nRBC 0.0 0.0 - 0.2 %   Neutrophils Relative % 45 %   Neutro Abs 1.6 (L) 1.7 - 7.7 K/uL   Lymphocytes Relative 44 %   Lymphs Abs 1.5 0.7 - 4.0 K/uL   Monocytes Relative 8 %   Monocytes Absolute 0.3 0.1 - 1.0 K/uL   Eosinophils Relative 2 %   Eosinophils Absolute 0.1 0.0 - 0.5 K/uL   Basophils Relative 1 %   Basophils Absolute 0.0 0.0 - 0.1 K/uL   Immature Granulocytes 0 %   Abs Immature Granulocytes 0.00 0.00 - 0.07 K/uL    Comment: Performed at [facility], [address] [place], [place] [address]  Comprehensive metabolic panel     Status: None   Collection Time: 04/19/24  2:32 PM  Result Value Ref Range   Sodium 138 135 - 145 mmol/L   Potassium 4.2 3.5 - 5.1 mmol/L   Chloride 107 98 - 111 mmol/L   CO2 24 22 - 32 mmol/L   Glucose, Bld 88 70 - 99 mg/dL    Comment: Glucose reference range applies only to samples taken after fasting for at least 8 hours.   BUN 12 6 - 20 mg/dL   Creatinine, Ser 1.61 0.61 - 1.24 mg/dL   Calcium 9.4 8.9 - 09.6 mg/dL   Total Protein 6.9 6.5 - 8.1 g/dL   Albumin 4.1 3.5 - 5.0 g/dL   AST 22 15 - 41 U/L   ALT 16 0 - 44 U/L   Alkaline Phosphatase 45 38 - 126 U/L   Total Bilirubin 0.7 0.0 - 1.2 mg/dL   GFR, Estimated >04 >54 mL/min    Comment: (NOTE) Calculated using the CKD-EPI Creatinine Equation (2021)    Anion gap 7 5 - 15    Comment: Performed at [facility], [address] [place], [place] [address]  Lipase, blood     Status: None   Collection Time: 04/19/24  2:32 PM  Result  Value Ref Range   Lipase 31 11 - 51 U/L    Comment: Performed at [facility], [address] [place], [place] [address]    CT ABDOMEN PELVIS W CONTRAST Result Date: 04/19/2024 CLINICAL DATA:  RLQ abdominal pain EXAM: CT ABDOMEN AND PELVIS WITH CONTRAST TECHNIQUE: Multidetector CT imaging of the abdomen and pelvis was performed using the standard protocol following bolus administration of intravenous contrast. RADIATION DOSE REDUCTION: This exam was performed according to the departmental dose-optimization program which includes automated exposure control, adjustment of the mA and/or kV according to patient size and/or use of iterative reconstruction technique. CONTRAST:  75mL OMNIPAQUE IOHEXOL 350 MG/ML SOLN COMPARISON:  None Available. FINDINGS: Lower chest: No acute abnormality. Hepatobiliary: The hepatic parenchyma is mildly diffusely hypodense compared to the splenic parenchyma consistent with fatty infiltration. No focal liver abnormality. No gallstones, gallbladder wall thickening, or pericholecystic fluid. No biliary dilatation. Pancreas: No focal lesion. Normal pancreatic contour. No surrounding inflammatory changes. No main pancreatic ductal dilatation. Spleen: Normal in size without focal abnormality. Adrenals/Urinary Tract: No adrenal nodule bilaterally. Malrotated pelvic left kidney. Bilateral kidneys enhance symmetrically. No hydronephrosis. No hydroureter. The urinary bladder is unremarkable. Stomach/Bowel: Stomach is within normal limits. Left mid abdomen small small bowel intussusception. No evidence of bowel wall thickening or dilatation. Appendix appears normal. Vascular/Lymphatic: No abdominal aorta or iliac aneurysm. No abdominal, pelvic, or inguinal lymphadenopathy. Reproductive: Prostate is unremarkable. Other: No intraperitoneal free fluid. No intraperitoneal free gas. No organized fluid collection. Musculoskeletal: No abdominal wall hernia or abnormality. No suspicious lytic or  blastic osseous lesions. No acute displaced fracture. Multilevel degenerative changes of the spine. IMPRESSION: 1. No acute intra-abdominal or intrapelvic abnormality. 2. Incidentally noted left mid abdomen small small bowel intussusception. 3. Incidentally noted malrotated pelvic left kidney. Electronically Signed   By: Morgane  Naveau M.D.   On: 04/19/2024 20:27    ROS 10 point review of systems is negative except as listed above in HPI.   Physical Exam Blood pressure 118/77, pulse (!) 59, temperature 98.1 F (36.7 C), temperature source Oral, resp. rate 18, height 5\' 7"  (1.702 m), weight 65.8 kg, SpO2 100%. Constitutional: well-developed, well-nourished HEENT: pupils equal, round, reactive to light, 2mm b/l, moist conjunctiva, external inspection of ears and nose normal, hearing intact Oropharynx: normal oropharyngeal  mucosa, normal dentition Neck: no thyromegaly, trachea midline, no midline cervical tenderness to palpation Chest: breath sounds equal bilaterally, normal respiratory effort, no midline or lateral chest wall tenderness to palpation/deformity Abdomen: soft, mild central TTP, no bruising, no hepatosplenomegaly Skin: warm, dry, no rashes Psych: normal memory, normal mood/affect     Assessment/Plan: Intussusception - normal vitals and labs, mild central TTP on exam. Recommend repeat CT A/P with PO contrast.  FEN - NPO except sips/chips DVT - SCDs, LMWH Dispo - admit to medicine    Anda Bamberg, MD General and Trauma Surgery [facility]

## 2024-04-19 NOTE — ED Notes (Signed)
 Patient transported to CT

## 2024-04-20 ENCOUNTER — Encounter (HOSPITAL_COMMUNITY): Admission: EM | Disposition: A | Discharge status: Home / Self Care | Payer: Self-pay | Source: Home / Self Care

## 2024-04-20 ENCOUNTER — Other Ambulatory Visit: Payer: Self-pay

## 2024-04-20 ENCOUNTER — Encounter (HOSPITAL_COMMUNITY): Payer: Self-pay | Admitting: Internal Medicine

## 2024-04-20 ENCOUNTER — Ambulatory Visit: Payer: Self-pay | Admitting: General Surgery

## 2024-04-20 ENCOUNTER — Observation Stay (HOSPITAL_COMMUNITY): Admitting: Anesthesiology

## 2024-04-20 ENCOUNTER — Observation Stay (HOSPITAL_COMMUNITY)

## 2024-04-20 DIAGNOSIS — K56609 Unspecified intestinal obstruction, unspecified as to partial versus complete obstruction: Secondary | ICD-10-CM

## 2024-04-20 DIAGNOSIS — K561 Intussusception: Secondary | ICD-10-CM | POA: Diagnosis not present

## 2024-04-20 HISTORY — PX: LAPAROSCOPY: SHX197

## 2024-04-20 LAB — BASIC METABOLIC PANEL WITH GFR
Anion gap: 9 (ref 5–15)
BUN: 13 mg/dL (ref 6–20)
CO2: 25 mmol/L (ref 22–32)
Calcium: 9.1 mg/dL (ref 8.9–10.3)
Chloride: 104 mmol/L (ref 98–111)
Creatinine, Ser: 1.14 mg/dL (ref 0.61–1.24)
GFR, Estimated: >60 mL/min (ref >60)
Glucose, Bld: 77 mg/dL (ref 70–99)
Potassium: 4.2 mmol/L (ref 3.5–5.1)
Sodium: 138 mmol/L (ref 135–145)

## 2024-04-20 LAB — TECHNOLOGIST SMEAR REVIEW: Clinical Information: BORDERLINE

## 2024-04-20 LAB — CBC WITH DIFFERENTIAL/PLATELET
Abs Immature Granulocytes: 0.01 10*3/uL (ref 0.00–0.07)
Basophils Absolute: 0 10*3/uL (ref 0.0–0.1)
Basophils Relative: 1 %
Eosinophils Absolute: 0.1 10*3/uL (ref 0.0–0.5)
Eosinophils Relative: 2 %
HCT: 45 % (ref 39.0–52.0)
Hemoglobin: 15.5 g/dL (ref 13.0–17.0)
Immature Granulocytes: 0 %
Lymphocytes Relative: 48 %
Lymphs Abs: 1.9 10*3/uL (ref 0.7–4.0)
MCH: 30 pg (ref 26.0–34.0)
MCHC: 34.4 g/dL (ref 30.0–36.0)
MCV: 87 fL (ref 80.0–100.0)
Monocytes Absolute: 0.3 10*3/uL (ref 0.1–1.0)
Monocytes Relative: 7 %
Neutro Abs: 1.6 10*3/uL — ABNORMAL LOW (ref 1.7–7.7)
Neutrophils Relative %: 42 %
Platelets: 202 10*3/uL (ref 150–400)
RBC: 5.17 MIL/uL (ref 4.22–5.81)
RDW: 11.9 % (ref 11.5–15.5)
WBC: 3.9 10*3/uL — ABNORMAL LOW (ref 4.0–10.5)
nRBC: 0 % (ref 0.0–0.2)

## 2024-04-20 LAB — TYPE AND SCREEN
ABO/RH(D): O POS
Antibody Screen: NEGATIVE

## 2024-04-20 LAB — HIV ANTIBODY (ROUTINE TESTING W REFLEX): HIV Screen 4th Generation wRfx: NONREACTIVE

## 2024-04-20 LAB — MAGNESIUM: Magnesium: 2 mg/dL (ref 1.7–2.4)

## 2024-04-20 LAB — PHOSPHORUS: Phosphorus: 4.4 mg/dL (ref 2.5–4.6)

## 2024-04-20 LAB — ABO/RH: ABO/RH(D): O POS

## 2024-04-20 SURGERY — LAPAROSCOPY, DIAGNOSTIC
Anesthesia: General

## 2024-04-20 MED ORDER — OXYCODONE HCL 5 MG PO TABS
2.5000 mg | ORAL_TABLET | ORAL | Status: DC | PRN
  Administered 2024-04-20: 2.5 mg via ORAL
  Filled 2024-04-20: qty 1

## 2024-04-20 MED ORDER — CHLORHEXIDINE GLUCONATE 0.12 % MT SOLN
15.0000 mL | Freq: Once | OROMUCOSAL | Status: AC
  Administered 2024-04-20: 15 mL via OROMUCOSAL

## 2024-04-20 MED ORDER — ONDANSETRON HCL 4 MG/2ML IJ SOLN
INTRAMUSCULAR | Status: AC
  Filled 2024-04-20: qty 2

## 2024-04-20 MED ORDER — SIMETHICONE 80 MG PO CHEW
80.0000 mg | CHEWABLE_TABLET | Freq: Four times a day (QID) | ORAL | Status: DC | PRN
  Administered 2024-04-21: 80 mg via ORAL
  Filled 2024-04-20: qty 1

## 2024-04-20 MED ORDER — MELATONIN 3 MG PO TABS: 6.0000 mg | ORAL_TABLET | Freq: Every evening | ORAL | Status: DC | PRN

## 2024-04-20 MED ORDER — FENTANYL CITRATE (PF) 250 MCG/5ML IJ SOLN
INTRAMUSCULAR | Status: DC | PRN
  Administered 2024-04-20: 100 ug via INTRAVENOUS
  Administered 2024-04-20: 50 ug via INTRAVENOUS

## 2024-04-20 MED ORDER — DEXMEDETOMIDINE HCL IN NACL 80 MCG/20ML IV SOLN
INTRAVENOUS | Status: DC | PRN
  Administered 2024-04-20 (×4): 8 ug via INTRAVENOUS

## 2024-04-20 MED ORDER — CHLORHEXIDINE GLUCONATE 0.12 % MT SOLN
OROMUCOSAL | Status: AC
  Filled 2024-04-20: qty 15

## 2024-04-20 MED ORDER — ONDANSETRON HCL 4 MG/2ML IJ SOLN
INTRAMUSCULAR | Status: DC | PRN
  Administered 2024-04-20: 4 mg via INTRAVENOUS

## 2024-04-20 MED ORDER — HYDROCORTISONE (PERIANAL) 2.5 % EX CREA
TOPICAL_CREAM | Freq: Two times a day (BID) | CUTANEOUS | Status: DC
  Filled 2024-04-20: qty 28.35

## 2024-04-20 MED ORDER — ACETAMINOPHEN 500 MG PO TABS
ORAL_TABLET | ORAL | Status: AC
  Filled 2024-04-20: qty 2

## 2024-04-20 MED ORDER — ACETAMINOPHEN 10 MG/ML IV SOLN: 1000.0000 mg | Freq: Once | INTRAVENOUS | Status: DC | PRN

## 2024-04-20 MED ORDER — ONDANSETRON HCL 4 MG/2ML IJ SOLN
4.0000 mg | Freq: Four times a day (QID) | INTRAMUSCULAR | Status: DC | PRN
  Administered 2024-04-21: 4 mg via INTRAVENOUS
  Filled 2024-04-20: qty 2

## 2024-04-20 MED ORDER — ACETAMINOPHEN 500 MG PO TABS: 1000.0000 mg | ORAL_TABLET | Freq: Four times a day (QID) | ORAL | Status: DC | PRN

## 2024-04-20 MED ORDER — ROCURONIUM BROMIDE 10 MG/ML (PF) SYRINGE
PREFILLED_SYRINGE | INTRAVENOUS | Status: DC | PRN
  Administered 2024-04-20 (×2): 5 mg via INTRAVENOUS
  Administered 2024-04-20: 60 mg via INTRAVENOUS

## 2024-04-20 MED ORDER — LACTATED RINGERS IV SOLN: INTRAVENOUS | Status: DC

## 2024-04-20 MED ORDER — FENTANYL CITRATE (PF) 100 MCG/2ML IJ SOLN: 25.0000 ug | INTRAMUSCULAR | Status: DC | PRN

## 2024-04-20 MED ORDER — BUPIVACAINE-EPINEPHRINE 0.25% -1:200000 IJ SOLN
INTRAMUSCULAR | Status: DC | PRN
  Administered 2024-04-20: 20 mL

## 2024-04-20 MED ORDER — GABAPENTIN 300 MG PO CAPS
ORAL_CAPSULE | ORAL | Status: AC
  Filled 2024-04-20: qty 1

## 2024-04-20 MED ORDER — PHENYLEPHRINE 80 MCG/ML (10ML) SYRINGE FOR IV PUSH (FOR BLOOD PRESSURE SUPPORT)
PREFILLED_SYRINGE | INTRAVENOUS | Status: AC
  Filled 2024-04-20: qty 10

## 2024-04-20 MED ORDER — 0.9 % SODIUM CHLORIDE (POUR BTL) OPTIME
TOPICAL | Status: DC | PRN
  Administered 2024-04-20: 1000 mL

## 2024-04-20 MED ORDER — OXYCODONE HCL 5 MG/5ML PO SOLN: 5.0000 mg | Freq: Once | ORAL | Status: DC | PRN

## 2024-04-20 MED ORDER — OXYCODONE HCL 5 MG PO TABS: 5.0000 mg | ORAL_TABLET | Freq: Once | ORAL | Status: DC | PRN

## 2024-04-20 MED ORDER — PROPOFOL 10 MG/ML IV BOLUS
INTRAVENOUS | Status: DC | PRN
  Administered 2024-04-20: 40 mg via INTRAVENOUS
  Administered 2024-04-20: 140 mg via INTRAVENOUS
  Administered 2024-04-20: 20 mg via INTRAVENOUS

## 2024-04-20 MED ORDER — FENTANYL CITRATE (PF) 250 MCG/5ML IJ SOLN
INTRAMUSCULAR | Status: AC
  Filled 2024-04-20: qty 5

## 2024-04-20 MED ORDER — CEFAZOLIN SODIUM-DEXTROSE 2-4 GM/100ML-% IV SOLN
2.0000 g | INTRAVENOUS | Status: AC
  Administered 2024-04-20: 2 g via INTRAVENOUS

## 2024-04-20 MED ORDER — DEXAMETHASONE SODIUM PHOSPHATE 10 MG/ML IJ SOLN
INTRAMUSCULAR | Status: AC
  Filled 2024-04-20: qty 1

## 2024-04-20 MED ORDER — CEFAZOLIN SODIUM-DEXTROSE 2-4 GM/100ML-% IV SOLN
INTRAVENOUS | Status: AC
  Filled 2024-04-20: qty 100

## 2024-04-20 MED ORDER — ORAL CARE MOUTH RINSE: 15.0000 mL | Freq: Once | OROMUCOSAL | Status: AC

## 2024-04-20 MED ORDER — PROPOFOL 10 MG/ML IV BOLUS
INTRAVENOUS | Status: AC
  Filled 2024-04-20: qty 20

## 2024-04-20 MED ORDER — DEXAMETHASONE SODIUM PHOSPHATE 10 MG/ML IJ SOLN
INTRAMUSCULAR | Status: DC | PRN
  Administered 2024-04-20: 10 mg via INTRAVENOUS

## 2024-04-20 MED ORDER — SUGAMMADEX SODIUM 200 MG/2ML IV SOLN
INTRAVENOUS | Status: DC | PRN
  Administered 2024-04-20: 200 mg via INTRAVENOUS

## 2024-04-20 MED ORDER — CHLORHEXIDINE GLUCONATE CLOTH 2 % EX PADS
6.0000 | MEDICATED_PAD | Freq: Once | CUTANEOUS | Status: AC
  Administered 2024-04-20: 6 via TOPICAL

## 2024-04-20 MED ORDER — ENOXAPARIN SODIUM 40 MG/0.4ML IJ SOSY: 40.0000 mg | PREFILLED_SYRINGE | INTRAMUSCULAR | Status: DC

## 2024-04-20 MED ORDER — DEXMEDETOMIDINE HCL IN NACL 80 MCG/20ML IV SOLN
INTRAVENOUS | Status: AC
  Filled 2024-04-20: qty 20

## 2024-04-20 MED ORDER — BUPIVACAINE-EPINEPHRINE (PF) 0.25% -1:200000 IJ SOLN
INTRAMUSCULAR | Status: AC
  Filled 2024-04-20: qty 30

## 2024-04-20 MED ORDER — KETOROLAC TROMETHAMINE 30 MG/ML IJ SOLN
INTRAMUSCULAR | Status: DC | PRN
  Administered 2024-04-20: 30 mg via INTRAVENOUS

## 2024-04-20 MED ORDER — LIDOCAINE 2% (20 MG/ML) 5 ML SYRINGE
INTRAMUSCULAR | Status: DC | PRN
  Administered 2024-04-20: 60 mg via INTRAVENOUS

## 2024-04-20 MED ORDER — ACETAMINOPHEN 500 MG PO TABS
1000.0000 mg | ORAL_TABLET | ORAL | Status: AC
Start: 2024-04-20 — End: 2024-04-20
  Administered 2024-04-20: 1000 mg via ORAL

## 2024-04-20 MED ORDER — GABAPENTIN 300 MG PO CAPS
300.0000 mg | ORAL_CAPSULE | ORAL | Status: AC
  Administered 2024-04-20: 300 mg via ORAL

## 2024-04-20 MED ORDER — LIDOCAINE 2% (20 MG/ML) 5 ML SYRINGE
INTRAMUSCULAR | Status: AC
  Filled 2024-04-20: qty 5

## 2024-04-20 MED ORDER — MIDAZOLAM HCL 2 MG/2ML IJ SOLN
INTRAMUSCULAR | Status: AC
  Filled 2024-04-20: qty 2

## 2024-04-20 MED ORDER — KETOROLAC TROMETHAMINE 30 MG/ML IJ SOLN
30.0000 mg | Freq: Four times a day (QID) | INTRAMUSCULAR | Status: DC | PRN
  Administered 2024-04-20 – 2024-04-21 (×2): 30 mg via INTRAVENOUS
  Filled 2024-04-20 (×2): qty 1

## 2024-04-20 MED ORDER — CHLORHEXIDINE GLUCONATE CLOTH 2 % EX PADS: 6.0000 | MEDICATED_PAD | Freq: Once | CUTANEOUS | Status: DC

## 2024-04-20 MED ORDER — MIDAZOLAM HCL 2 MG/2ML IJ SOLN
INTRAMUSCULAR | Status: DC | PRN
  Administered 2024-04-20: 2 mg via INTRAVENOUS

## 2024-04-20 MED ORDER — KETOROLAC TROMETHAMINE 30 MG/ML IJ SOLN
INTRAMUSCULAR | Status: AC
  Filled 2024-04-20: qty 1

## 2024-04-20 SURGICAL SUPPLY — 28 items
BAG COUNTER SPONGE SURGICOUNT (BAG) ×1 IMPLANT
BLADE CLIPPER SURG (BLADE) IMPLANT
CANISTER SUCTION 3000ML PPV (SUCTIONS) IMPLANT
CHLORAPREP W/TINT 26 (MISCELLANEOUS) ×1 IMPLANT
COVER SURGICAL LIGHT HANDLE (MISCELLANEOUS) ×1 IMPLANT
DERMABOND ADVANCED .7 DNX12 (GAUZE/BANDAGES/DRESSINGS) IMPLANT
ELECTRODE REM PT RTRN 9FT ADLT (ELECTROSURGICAL) ×1 IMPLANT
GLOVE BIOGEL PI IND STRL 7.5 (GLOVE) ×1 IMPLANT
GOWN STRL REUS W/ TWL LRG LVL3 (GOWN DISPOSABLE) ×2 IMPLANT
GOWN STRL REUS W/ TWL XL LVL3 (GOWN DISPOSABLE) ×1 IMPLANT
IRRIGATION SUCT STRKRFLW 2 WTP (MISCELLANEOUS) IMPLANT
KIT BASIN OR (CUSTOM PROCEDURE TRAY) ×1 IMPLANT
KIT TURNOVER KIT B (KITS) ×1 IMPLANT
NDL INSUFFLATION 14GA 120MM (NEEDLE) ×1 IMPLANT
NEEDLE INSUFFLATION 14GA 120MM (NEEDLE) ×1 IMPLANT
NS IRRIG 1000ML POUR BTL (IV SOLUTION) ×1 IMPLANT
PAD ARMBOARD POSITIONER FOAM (MISCELLANEOUS) ×2 IMPLANT
SCISSORS LAP 5X35 DISP (ENDOMECHANICALS) IMPLANT
SET TUBE SMOKE EVAC HIGH FLOW (TUBING) ×1 IMPLANT
SLEEVE Z-THREAD 5X100MM (TROCAR) ×1 IMPLANT
SUT MNCRL AB 4-0 PS2 18 (SUTURE) ×1 IMPLANT
TOWEL GREEN STERILE (TOWEL DISPOSABLE) ×1 IMPLANT
TOWEL GREEN STERILE FF (TOWEL DISPOSABLE) ×1 IMPLANT
TRAY LAPAROSCOPIC MC (CUSTOM PROCEDURE TRAY) ×1 IMPLANT
TROCAR 11X100 Z THREAD (TROCAR) IMPLANT
TROCAR BALLN 12MMX100 BLUNT (TROCAR) IMPLANT
TROCAR Z-THREAD OPTICAL 5X100M (TROCAR) ×1 IMPLANT
WARMER LAPAROSCOPE (MISCELLANEOUS) ×1 IMPLANT

## 2024-04-20 NOTE — Plan of Care (Signed)

## 2024-04-20 NOTE — ED Provider Notes (Signed)
 Calvin Fuller EMERGENCY DEPARTMENT AT [facility] Provider Note   CSN: [id number] Arrival date & time: 04/19/24  1409     History  Chief Complaint  Patient presents with   Abdominal Pain    Calvin Fuller is a 23 y.o. male.  23 year old male presenting emergency department for abdominal pain.  Started today.  He has had 2 weeks of constipation.  Pain seemingly localized to the lower abdomen.  No nausea no vomiting.  Did have some blood on toilet paper when wiping, but no blood in stool.  No chest pain no shortness of breath.  Not on blood thinners.  No prior abdominal surgeries.   Abdominal Pain      Home Medications Prior to Admission medications   Medication Sig Start Date End Date Taking? Authorizing Provider  clobetasol  (TEMOVATE ) 0.05 % external solution Apply 1 Application topically 2 (two) times daily. To scalp as needed. Avoid applying to face, groin, and axilla. Use as directed. Long-term use can cause thinning of the skin. Patient not taking: Reported on 04/19/2024 07/13/23   Harris Liming, MD  guaifenesin  (HUMIBID E) 400 MG TABS tablet Take 1 tablet 3 times daily as needed for chest congestion and cough Patient not taking: Reported on 04/19/2024 11/10/23   Eloise Hake Scales, PA-C  ketoconazole  (NIZORAL ) 2 % shampoo Apply 1 Application topically 2 (two) times a week. Wash scalp 2 times weekly, let sit 10 minutes and wash out Patient not taking: Reported on 04/19/2024 09/07/23   Elta Halter, MD  minoxidil  (LONITEN ) 2.5 MG tablet Take 1 tablet (2.5 mg total) by mouth daily. Patient not taking: Reported on 04/19/2024 09/05/23   Elta Halter, MD  promethazine -dextromethorphan (PROMETHAZINE -DM) 6.25-15 MG/5ML syrup Take 5 mLs by mouth at bedtime as needed for cough. Patient not taking: Reported on 04/19/2024 11/10/23   Eloise Hake Scales, PA-C  risankizumab -rzaa (SKYRIZI ) 150 MG/ML pen Inject unde the skin every 12 weeks thereafter Patient  not taking: Reported on 04/19/2024 07/13/23   Harris Liming, MD  risankizumab -rzaa (SKYRIZI ) 150 MG/ML pen 150 mg subQ at Week 0 and Week 4 Patient not taking: Reported on 04/19/2024 07/13/23   Harris Liming, MD  Roflumilast  (ZORYVE ) 0.3 % CREA Apply 1 Application topically daily. qd to aa psoriasis on body Patient not taking: Reported on 04/19/2024 09/05/23   Elta Halter, MD  triamcinolone  cream (KENALOG ) 0.1 % Apply to aa's rash on the body and arms QD-BID PRN up to 5d/wk. Avoid applying to face, groin, and axilla. Use as directed. Long-term use can cause thinning of the skin. Patient not taking: Reported on 04/19/2024 07/11/23   Elta Halter, MD      Allergies    Patient has no known allergies.    Review of Systems   Review of Systems  Gastrointestinal:  Positive for abdominal pain.    Physical Exam Updated Vital Signs BP 108/63   Pulse (!) 58   Temp 98.2 F (36.8 C) (Oral)   Resp 18   Ht 5\' 7"  (1.702 m)   Wt 65.8 kg   SpO2 100%   BMI 22.71 kg/m  Physical Exam Vitals and nursing note reviewed.  Constitutional:      General: He is not in acute distress.    Appearance: He is not toxic-appearing.  HENT:     Head: Normocephalic.  Cardiovascular:     Rate and Rhythm: Normal rate and regular rhythm.  Pulmonary:     Effort: Pulmonary effort is normal.  Breath sounds: Normal breath sounds.  Abdominal:     Palpations: Abdomen is soft.     Tenderness: There is abdominal tenderness in the right lower quadrant, suprapubic area and left lower quadrant.  Skin:    General: Skin is warm and dry.  Neurological:     General: No focal deficit present.     Mental Status: He is alert.  Psychiatric:        Mood and Affect: Mood normal.        Behavior: Behavior normal.     ED Results / Procedures / Treatments   Labs (all labs ordered are listed, but only abnormal results are displayed) Labs Reviewed  CBC WITH DIFFERENTIAL/PLATELET - Abnormal; Notable for the  following components:      Result Value   WBC 3.5 (*)    Neutro Abs 1.6 (*)    All other components within normal limits  COMPREHENSIVE METABOLIC PANEL WITH GFR  LIPASE, BLOOD  URINALYSIS, ROUTINE W REFLEX MICROSCOPIC    EKG None  Radiology CT ABDOMEN PELVIS W CONTRAST Result Date: 04/19/2024 CLINICAL DATA:  RLQ abdominal pain EXAM: CT ABDOMEN AND PELVIS WITH CONTRAST TECHNIQUE: Multidetector CT imaging of the abdomen and pelvis was performed using the standard protocol following bolus administration of intravenous contrast. RADIATION DOSE REDUCTION: This exam was performed according to the departmental dose-optimization program which includes automated exposure control, adjustment of the mA and/or kV according to patient size and/or use of iterative reconstruction technique. CONTRAST:  75mL OMNIPAQUE IOHEXOL 350 MG/ML SOLN COMPARISON:  None Available. FINDINGS: Lower chest: No acute abnormality. Hepatobiliary: The hepatic parenchyma is mildly diffusely hypodense compared to the splenic parenchyma consistent with fatty infiltration. No focal liver abnormality. No gallstones, gallbladder wall thickening, or pericholecystic fluid. No biliary dilatation. Pancreas: No focal lesion. Normal pancreatic contour. No surrounding inflammatory changes. No main pancreatic ductal dilatation. Spleen: Normal in size without focal abnormality. Adrenals/Urinary Tract: No adrenal nodule bilaterally. Malrotated pelvic left kidney. Bilateral kidneys enhance symmetrically. No hydronephrosis. No hydroureter. The urinary bladder is unremarkable. Stomach/Bowel: Stomach is within normal limits. Left mid abdomen small small bowel intussusception. No evidence of bowel wall thickening or dilatation. Appendix appears normal. Vascular/Lymphatic: No abdominal aorta or iliac aneurysm. No abdominal, pelvic, or inguinal lymphadenopathy. Reproductive: Prostate is unremarkable. Other: No intraperitoneal free fluid. No intraperitoneal  free gas. No organized fluid collection. Musculoskeletal: No abdominal wall hernia or abnormality. No suspicious lytic or blastic osseous lesions. No acute displaced fracture. Multilevel degenerative changes of the spine. IMPRESSION: 1. No acute intra-abdominal or intrapelvic abnormality. 2. Incidentally noted left mid abdomen small small bowel intussusception. 3. Incidentally noted malrotated pelvic left kidney. Electronically Signed   By: Morgane  Naveau M.D.   On: 04/19/2024 20:27    Procedures Procedures    Medications Ordered in ED Medications  iohexol (OMNIPAQUE) 9 MG/ML oral solution 500 mL (has no administration in time range)  ketorolac (TORADOL) 15 MG/ML injection 15 mg (15 mg Intravenous Given 04/19/24 1829)  iohexol (OMNIPAQUE) 350 MG/ML injection 75 mL (75 mLs Intravenous Contrast Given 04/19/24 1909)    ED Course/ Medical Decision Making/ A&P Clinical Course as of 04/20/24 0003  Fri Apr 19, 2024  2110 Case discussed with Dr. Thelbert Finner who will will review images and help with disposition. [TY]    Clinical Course User Index [TY] Rolinda Climes, DO  Medical Decision Making 23 year old male presenting emergency department with lower abdominal pain.  He is afebrile nontachycardic, normotensive.  Largely benign abdominal exam soft, but did have some lower abdominal tenderness.  Does not appear to have systemic infection, slightly leukopenic.  No metabolic derangements.  No transaminitis or elevated lipase to suggest hepatobiliary disease or pancreatitis.  Urine not consistent with UTI.  Given his lower abdominal tenderness.  Ordered CT scan to evaluate for appendicitis.  No appendicitis identified, but did show small bowel intussusception.  Case discussed with Dr. Thelbert Finner recommending repeating CT scan with oral contrast and admission for observation.  Recommending admission to medicine service.  Case discussed with hospitalist, Dr. Segars who will admit  patient.  Amount and/or Complexity of Data Reviewed External Data Reviewed:     Details: Seen in urgent care for abdominal bloating.  No prior CT scans in chart. Radiology: ordered and independent interpretation performed.    Details: No free air on my independent interpretation.  Risk Prescription drug management. Decision regarding hospitalization. Diagnosis or treatment significantly limited by social determinants of health.          Final Clinical Impression(s) / ED Diagnoses Final diagnoses:  Intussusception [facility])    Rx / DC Orders ED Discharge Orders     None         Rolinda Climes, DO 04/20/24 0003

## 2024-04-20 NOTE — Anesthesia Procedure Notes (Signed)
 Procedure Name: Intubation Date/Time: 04/20/2024 1:53 PM  Performed by: Candance Certain, CRNAPre-anesthesia Checklist: Patient identified, Emergency Drugs available, Suction available and Patient being monitored Patient Re-evaluated:Patient Re-evaluated prior to induction Oxygen Delivery Method: Circle System Utilized Preoxygenation: Pre-oxygenation with 100% oxygen Induction Type: IV induction Ventilation: Mask ventilation without difficulty Laryngoscope Size: Mac and 4 Grade View: Grade I Tube type: Oral Tube size: 7.5 mm Number of attempts: 1 Airway Equipment and Method: Stylet and Oral airway Placement Confirmation: ETT inserted through vocal cords under direct vision, positive ETCO2 and breath sounds checked- equal and bilateral Secured at: 23 cm Tube secured with: Tape Dental Injury: Teeth and Oropharynx as per pre-operative assessment  Comments: Atraumatic induction/intubation. Dentition and oral mucosa as per preop.

## 2024-04-20 NOTE — Transfer of Care (Signed)
 Immediate Anesthesia Transfer of Care Note  Patient: Calvin Fuller  Procedure(s) Performed: LAPAROSCOPY, DIAGNOSTIC  Patient Location: PACU  Anesthesia Type:General  Level of Consciousness: drowsy and responds to stimulation  Airway & Oxygen Therapy: Patient Spontanous Breathing and Patient connected to face mask oxygen  Post-op Assessment: Report given to RN and Post -op Vital signs reviewed and stable  Post vital signs: Reviewed and stable  Last Vitals:  Vitals Value Taken Time  BP 127/78 04/20/24 1535  Temp 36.6 C 04/20/24 1535  Pulse 61 04/20/24 1540  Resp 20 04/20/24 1539  SpO2 95 % 04/20/24 1540  Vitals shown include unfiled device data.  Last Pain:  Vitals:   04/20/24 1535  TempSrc:   PainSc: Asleep     OPA left in place    Complications: No notable events documented.

## 2024-04-20 NOTE — H&P (Signed)
 History and Physical    Calvin Fuller ZOX:[id number] DOB: 23-Jun-2001 DOA: 04/19/2024  PCP: Pcp, No   Patient coming from: Home   Chief Complaint:  Chief Complaint  Patient presents with   Abdominal Pain    HPI:  DOCTOR SHEAHAN is a 23 y.o. male with hx of psoriasis who presented due to acute onset of abdominal pain.  Reports that over the past few weeks he has had incomplete emptying of stool with constipation.  Denies small hard stools.  He has also had small amount of bright red blood on the toilet paper but no large-volume bleeding into the toilet bowl.  No melena.  Painless stools.  Yesterday awoke with severe abdominal pain describing sharp stabbing pain, non-ill localized.  Gradually improving 5 out of 10 on interview.  No nausea or vomiting but did have decreased appetite the day prior.  No other recent illness   Review of Systems:  ROS complete and negative except as marked above   No Known Allergies  Prior to Admission medications   Medication Sig Start Date End Date Taking? Authorizing Provider  clobetasol  (TEMOVATE ) 0.05 % external solution Apply 1 Application topically 2 (two) times daily. To scalp as needed. Avoid applying to face, groin, and axilla. Use as directed. Long-term use can cause thinning of the skin. Patient not taking: Reported on 04/19/2024 07/13/23   Harris Liming, MD  guaifenesin  (HUMIBID E) 400 MG TABS tablet Take 1 tablet 3 times daily as needed for chest congestion and cough Patient not taking: Reported on 04/19/2024 11/10/23   Eloise Hake Scales, PA-C  ketoconazole  (NIZORAL ) 2 % shampoo Apply 1 Application topically 2 (two) times a week. Wash scalp 2 times weekly, let sit 10 minutes and wash out Patient not taking: Reported on 04/19/2024 09/07/23   Elta Halter, MD  minoxidil  (LONITEN ) 2.5 MG tablet Take 1 tablet (2.5 mg total) by mouth daily. Patient not taking: Reported on 04/19/2024 09/05/23   Elta Halter, MD   promethazine -dextromethorphan (PROMETHAZINE -DM) 6.25-15 MG/5ML syrup Take 5 mLs by mouth at bedtime as needed for cough. Patient not taking: Reported on 04/19/2024 11/10/23   Eloise Hake Scales, PA-C  risankizumab -rzaa (SKYRIZI ) 150 MG/ML pen Inject unde the skin every 12 weeks thereafter Patient not taking: Reported on 04/19/2024 07/13/23   Harris Liming, MD  risankizumab -rzaa (SKYRIZI ) 150 MG/ML pen 150 mg subQ at Week 0 and Week 4 Patient not taking: Reported on 04/19/2024 07/13/23   Harris Liming, MD  Roflumilast  (ZORYVE ) 0.3 % CREA Apply 1 Application topically daily. qd to aa psoriasis on body Patient not taking: Reported on 04/19/2024 09/05/23   Elta Halter, MD  triamcinolone  cream (KENALOG ) 0.1 % Apply to aa's rash on the body and arms QD-BID PRN up to 5d/wk. Avoid applying to face, groin, and axilla. Use as directed. Long-term use can cause thinning of the skin. Patient not taking: Reported on 04/19/2024 07/11/23   Elta Halter, MD    History reviewed. No pertinent past medical history.  Past Surgical History:  Procedure Laterality Date   KNEE ARTHROSCOPY W/ ACL RECONSTRUCTION Left      reports that he has never smoked. He has been exposed to tobacco smoke. He has never used smokeless tobacco. He reports that he does not drink alcohol and does not use drugs.  History reviewed. No pertinent family history.   Physical Exam: Vitals:   04/19/24 2200 04/20/24 0003 04/20/24 0026 04/20/24 0423  BP: 108/63  118/85 118/78  Pulse: Aaron Aas)  58  (!) 57 (!) 56  Resp: 18  20 20   Temp: 98.2 F (36.8 C)  97.7 F (36.5 C) (!) 97.5 F (36.4 C)  TempSrc: Oral  Oral Oral  SpO2: 100% 97% 97% 99%  Weight:      Height:        Gen: Awake, alert, NAD   CV: Regular, normal S1, S2, no murmurs  Resp: Normal WOB, CTAB  Abd: Flat, normoactive, mild generalized tenderness, mild rebound, no guarding or rigidity. MSK: Symmetric, no edema  Skin: No rashes or lesions to exposed skin   Neuro: Alert and interactive  Psych: euthymic, appropriate    Data review:   Labs reviewed, notable for:   WBC 3.5, neutrophil 1.6  Micro:  Results for orders placed or performed during the hospital encounter of 08/01/22  SARS CORONAVIRUS 2 (TAT 6-24 HRS) Anterior Nasal Swab     Status: Abnormal   Collection Time: 08/01/22  5:13 PM   Specimen: Anterior Nasal Swab  Result Value Ref Range Status   SARS Coronavirus 2 POSITIVE (A) NEGATIVE Final    Comment: (NOTE) SARS-CoV-2 target nucleic acids are DETECTED.  The SARS-CoV-2 RNA is generally detectable in upper and lower respiratory specimens during the acute phase of infection. Positive results are indicative of the presence of SARS-CoV-2 RNA. Clinical correlation with patient history and other diagnostic information is  necessary to determine patient infection status. Positive results do not rule out bacterial infection or co-infection with other viruses.  The expected result is Negative.  Fact Sheet for Patients: HairSlick.no  Fact Sheet for Healthcare Providers: quierodirigir.com  This test is not yet approved or cleared by the [place]  FDA and  has been authorized for detection and/or diagnosis of SARS-CoV-2 by FDA under an Emergency Use Authorization (EUA). This EUA will remain  in effect (meaning this test can be used) for the duration of the COVID-19 declaration under Section 564(b)(1) of the Act, 21 U. S.C. section 360bbb-3(b)(1), unless the authorization is terminated or revoked sooner.   Performed at [facility], [address] [place], [place] [address]     Imaging reviewed:  CT ABDOMEN PELVIS WO CONTRAST Result Date: 04/20/2024 CLINICAL DATA:  Intussusception suspected EXAM: CT ABDOMEN AND PELVIS WITHOUT CONTRAST TECHNIQUE: Multidetector CT imaging of the abdomen and pelvis was performed following the standard protocol without IV contrast.  RADIATION DOSE REDUCTION: This exam was performed according to the departmental dose-optimization program which includes automated exposure control, adjustment of the mA and/or kV according to patient size and/or use of iterative reconstruction technique. COMPARISON:  CT from yesterday FINDINGS: Lower chest:  No contributory findings. Hepatobiliary: No focal liver abnormality.No evidence of biliary obstruction or stone. Pancreas: Unremarkable. Spleen: Unremarkable. Adrenals/Urinary Tract: Negative adrenals. No hydronephrosis or stone. Left pelvic kidney. Unremarkable bladder. Stomach/Bowel: Persisting enteroenteric intussusception in the left upper quadrant, telescoped segment measuring approximately 7 cm in length. No gross mass lesion or edema by noncontrast technique. No bowel obstruction however, enteric contrast has reached the transverse colon and there is no preferential dilatation of proximal loops. Vascular/Lymphatic: No acute vascular abnormality. No mass or adenopathy. Reproductive:No pathologic findings. Other: No ascites or pneumoperitoneum. Musculoskeletal: No acute abnormalities. IMPRESSION: Persisting enteroenteric intussusception in the left upper quadrant, although nonobstructive with oral contrast reaching the transverse colon. Left pelvic kidney. Electronically Signed   By: Ronnette Coke M.D.   On: 04/20/2024 04:31   CT ABDOMEN PELVIS W CONTRAST Result Date: 04/19/2024 CLINICAL DATA:  RLQ abdominal pain  EXAM: CT ABDOMEN AND PELVIS WITH CONTRAST TECHNIQUE: Multidetector CT imaging of the abdomen and pelvis was performed using the standard protocol following bolus administration of intravenous contrast. RADIATION DOSE REDUCTION: This exam was performed according to the departmental dose-optimization program which includes automated exposure control, adjustment of the mA and/or kV according to patient size and/or use of iterative reconstruction technique. CONTRAST:  75mL OMNIPAQUE IOHEXOL 350  MG/ML SOLN COMPARISON:  None Available. FINDINGS: Lower chest: No acute abnormality. Hepatobiliary: The hepatic parenchyma is mildly diffusely hypodense compared to the splenic parenchyma consistent with fatty infiltration. No focal liver abnormality. No gallstones, gallbladder wall thickening, or pericholecystic fluid. No biliary dilatation. Pancreas: No focal lesion. Normal pancreatic contour. No surrounding inflammatory changes. No main pancreatic ductal dilatation. Spleen: Normal in size without focal abnormality. Adrenals/Urinary Tract: No adrenal nodule bilaterally. Malrotated pelvic left kidney. Bilateral kidneys enhance symmetrically. No hydronephrosis. No hydroureter. The urinary bladder is unremarkable. Stomach/Bowel: Stomach is within normal limits. Left mid abdomen small small bowel intussusception. No evidence of bowel wall thickening or dilatation. Appendix appears normal. Vascular/Lymphatic: No abdominal aorta or iliac aneurysm. No abdominal, pelvic, or inguinal lymphadenopathy. Reproductive: Prostate is unremarkable. Other: No intraperitoneal free fluid. No intraperitoneal free gas. No organized fluid collection. Musculoskeletal: No abdominal wall hernia or abnormality. No suspicious lytic or blastic osseous lesions. No acute displaced fracture. Multilevel degenerative changes of the spine. IMPRESSION: 1. No acute intra-abdominal or intrapelvic abnormality. 2. Incidentally noted left mid abdomen small small bowel intussusception. 3. Incidentally noted malrotated pelvic left kidney. Electronically Signed   By: Morgane  Naveau M.D.   On: 04/19/2024 20:27    ED Course:  Treated with Toradol.  General surgery was consulted and have seen the patient   Assessment/Plan:  24 y.o. male with hx psoriasis who presented due to acute onset of abdominal pain found to have small bowel intussusception  Small bowel intussusception Few weeks of constipation and scant bright red blood on toilet paper. 1 day  of acute abdominal pain is likely related to intussusception identified on CT.  On the most recent CT with oral contrast intussusception of the small bowel measuring 7 cm - General Surgery following, appreciate recommendations - N.p.o. with ice chips and sips - Pain control Tylenol  prn mild, Toradol moderate, oxycodone severe, can escalate as needed  Leukopenia, borderline at neutropenia - Check HIV, smear  Chronic medical problems: Psoriasis not on medical therapy currently.  Body mass index is 22.71 kg/m.    DVT prophylaxis:  SCDs Code Status:  Full Code Diet:  Diet Orders (From admission, onward)     Start     Ordered   04/20/24 0004  Diet NPO time specified Except for: [employer], Sips with Meds  Diet effective now       Comments: OK for Sips of clears  Question Answer Comment  Except for [employer]   Except for Sips with Meds      04/20/24 0005           Family Communication:  None   Consults: General surgery   Admission status:   Observation, Med-Surg  Severity of Illness: The appropriate patient status for this patient is OBSERVATION. Observation status is judged to be reasonable and necessary in order to provide the required intensity of service to ensure the patient's safety. The patient's presenting symptoms, physical exam findings, and initial radiographic and laboratory data in the context of their medical condition is felt to place them at decreased risk for further clinical deterioration.  Furthermore, it is anticipated that the patient will be medically stable for discharge from the hospital within 2 midnights of admission.    Arnulfo Larch, MD [facility]  How to contact the [facility] Attending or Consulting provider 7A - 7P or covering provider during after hours 7P -7A, for this patient.  Check the care team in [facility] and look for a) attending/consulting [facility] provider listed and b) the [facility] team listed Log into [email or web address] and use [place]'s universal  password to access. If you do not have the password, please contact the hospital operator. Locate the [facility] provider you are looking for under [facility] and page to a number that you can be directly reached. If you still have difficulty reaching the provider, please page the [facility] (Director on Call) for the Hospitalists listed on amion for assistance.  04/20/2024, 5:39 AM

## 2024-04-20 NOTE — Op Note (Signed)
 04/19/2024 - 04/20/2024  3:14 PM  PATIENT:  Calvin Fuller  23 y.o. male  Patient Care Team: Pcp, No as PCP - General  PRE-OPERATIVE DIAGNOSIS:  Enteroenteric intussusception  POST-OPERATIVE DIAGNOSIS:  Same  PROCEDURE:  Diagnostic laparoscopy  SURGEON:  Cannon Champion, MD  ASSISTANT: None  ANESTHESIA:   general  COUNTS:  Sponge, needle and instrument counts were reported correct x2 at the conclusion of the operation.  EBL: Minimal  DRAINS: None  SPECIMEN: None  COMPLICATIONS: None  FINDINGS: Irregularities in the several areas of serosa within proximal small bowel. No obvious masses or other abnormalities identified.  DISPOSITION: PACU in satisfactory condition  INDICATION: Calvin Fuller is a 23 yo male who presented to the ED with abdominal pain. He underwent an initial CT that showed enteroenteric intussusception.  He continued to have abdominal pain and a repeat study several hours later showed persistence of a 7-cm segment enteroenteric intussusception. On my exam, he reported ongoing abdominal pain. Therefore, I offered to go to the operating room for a diagnostic laparoscopy and possible bowel resection. All of his questions were identified and written consent was obtained.  DESCRIPTION: The patient was identified in preop holding and taken to the OR where he was placed on the operating room table. SCDs were placed. General endotracheal anesthesia was induced without difficulty. The abdomen was then prepped and draped in the usual sterile fashion. A surgical timeout was performed indicating the correct patient, procedure, positioning and need for preoperative antibiotics.   I obtained abdominal access to the Veress needle in the left upper quadrant at Palmer's point.  Following a positive saline drop test and aspiration of air the insufflation tubing was connected and the abdomen was brought to 15 mmHg.  The patient tolerated this well.  I then placed a 5 mm trocar  in the right lower quadrant using Optiview technique.  There was no evidence of injury from entry.  I introduced 2 additional 5mm ports, one supraumbilical and one in the right lower quadrant.  The patient was placed in reverse Trendelenburg position.  I lifted the transverse colon and identified the LOT.  I began running the small bowel from proximal to distal.  At the start of the case the proximal small bowel was mildly dilated.  In the proximal to mid small bowel there were several areas where the serosa appeared to have been stretched.  There was no perforation or clear injury identified.  After reaching the terminal ileum I then ran the bowel from distal to proximal and back again.  I did not identify a clear intussusception and there was no evidence of intraluminal masses or other abnormalities. At the conclusion of the case the proximal small bowel appeared less distended.  The ports were removed and the port sites closed with interrupted 4-0 Monocryl.  0.25% Marcaine with epinephrine was used for local anesthesia.  Dermabond was applied.  The patient tolerated the procedure well and was taken to PACU by anesthesia.

## 2024-04-20 NOTE — Anesthesia Preprocedure Evaluation (Signed)
 Anesthesia Evaluation  Patient identified by MRN, date of birth, ID band Patient awake    Reviewed: Allergy & Precautions, NPO status , Patient's Chart, lab work & pertinent test results  History of Anesthesia Complications Negative for: history of anesthetic complications  Airway Mallampati: I  TM Distance: >3 FB Neck ROM: Full    Dental  (+) Teeth Intact, Dental Advisory Given   Pulmonary neg pulmonary ROS   breath sounds clear to auscultation       Cardiovascular negative cardio ROS  Rhythm:Regular     Neuro/Psych negative neurological ROS  negative psych ROS   GI/Hepatic Neg liver ROS,,, SMALL BOWEL OBSTRUCTION   Endo/Other  negative endocrine ROS    Renal/GU negative Renal ROS     Musculoskeletal negative musculoskeletal ROS (+)    Abdominal   Peds  Hematology negative hematology ROS (+)   Anesthesia Other Findings   Reproductive/Obstetrics                             Anesthesia Physical Anesthesia Plan  ASA: 1 and emergent  Anesthesia Plan: General   Post-op Pain Management: Toradol IV (intra-op)* and Tylenol  PO (pre-op)*   Induction: Intravenous  PONV Risk Score and Plan: 3 and Ondansetron  and Dexamethasone  Airway Management Planned: Oral ETT  Additional Equipment: None  Intra-op Plan:   Post-operative Plan: Extubation in OR  Informed Consent: I have reviewed the patients History and Physical, chart, labs and discussed the procedure including the risks, benefits and alternatives for the proposed anesthesia with the patient or authorized representative who has indicated his/her understanding and acceptance.     Dental advisory given  Plan Discussed with: CRNA  Anesthesia Plan Comments:        Anesthesia Quick Evaluation

## 2024-04-20 NOTE — Progress Notes (Signed)
 Briefly, patient is a 23 year old man who was admitted earlier this morning with abdominal pain.  Workup has revealed intussusception.  Patient was taken to the OR for diagnostic laparoscopy and findings include "irregularities in several areas of the serosa within the proximal bowel, no obvious masses or after abnormalities identified"and that there was no clear intussusception, no extraluminal masses, no abnormalities, no perforation or clear injury.    Patient states he is doing okay but notes he is very tired.  Feels pain is under reasonable control.   No change in present management, continue management as initiated earlier today.  As per general surgery recommendations.

## 2024-04-20 NOTE — Progress Notes (Addendum)
 Subjective: Abdominal pain has improved somewhat but still 5/10. + Flatus. No BM  ROS: See above, otherwise other systems negative  Objective: Vital signs in last 24 hours: Temp:  [97.5 F (36.4 C)-98.2 F (36.8 C)] 97.5 F (36.4 C) (05/24 0423) Pulse Rate:  [53-66] 56 (05/24 0423) Resp:  [16-20] 20 (05/24 0423) BP: (108-123)/(63-95) 118/78 (05/24 0423) SpO2:  [97 %-100 %] 99 % (05/24 0423) Weight:  [65.8 kg] 65.8 kg (05/23 1414)    Intake/Output from previous day: No intake/output data recorded. Intake/Output this shift: No intake/output data recorded.  PE: Gen: male, NAD Abd: soft, non-distended, mild TTP in the LUQ, no rebound/guarding, no peritoneal signs  Lab Results:  Recent Labs    04/19/24 1432  WBC 3.5*  HGB 15.6  HCT 46.1  PLT 225   BMET Recent Labs    04/19/24 1432  NA 138  K 4.2  CL 107  CO2 24  GLUCOSE 88  BUN 12  CREATININE 1.14  CALCIUM 9.4   PT/INR No results for input(s): "LABPROT", "INR" in the last 72 hours. CMP     Component Value Date/Time   NA 138 04/19/2024 1432   NA 139 08/30/2023 1643   K 4.2 04/19/2024 1432   CL 107 04/19/2024 1432   CO2 24 04/19/2024 1432   GLUCOSE 88 04/19/2024 1432   BUN 12 04/19/2024 1432   BUN 18 08/30/2023 1643   CREATININE 1.14 04/19/2024 1432   CALCIUM 9.4 04/19/2024 1432   PROT 6.9 04/19/2024 1432   PROT 6.8 08/30/2023 1643   ALBUMIN 4.1 04/19/2024 1432   ALBUMIN 4.6 08/30/2023 1643   AST 22 04/19/2024 1432   ALT 16 04/19/2024 1432   ALKPHOS 45 04/19/2024 1432   BILITOT 0.7 04/19/2024 1432   BILITOT 0.6 08/30/2023 1643   GFRNONAA >60 04/19/2024 1432   Lipase     Component Value Date/Time   LIPASE 31 04/19/2024 1432    Studies/Results: CT ABDOMEN PELVIS WO CONTRAST Result Date: 04/20/2024 CLINICAL DATA:  Intussusception suspected EXAM: CT ABDOMEN AND PELVIS WITHOUT CONTRAST TECHNIQUE: Multidetector CT imaging of the abdomen and pelvis was performed following the standard  protocol without IV contrast. RADIATION DOSE REDUCTION: This exam was performed according to the departmental dose-optimization program which includes automated exposure control, adjustment of the mA and/or kV according to patient size and/or use of iterative reconstruction technique. COMPARISON:  CT from yesterday FINDINGS: Lower chest:  No contributory findings. Hepatobiliary: No focal liver abnormality.No evidence of biliary obstruction or stone. Pancreas: Unremarkable. Spleen: Unremarkable. Adrenals/Urinary Tract: Negative adrenals. No hydronephrosis or stone. Left pelvic kidney. Unremarkable bladder. Stomach/Bowel: Persisting enteroenteric intussusception in the left upper quadrant, telescoped segment measuring approximately 7 cm in length. No gross mass lesion or edema by noncontrast technique. No bowel obstruction however, enteric contrast has reached the transverse colon and there is no preferential dilatation of proximal loops. Vascular/Lymphatic: No acute vascular abnormality. No mass or adenopathy. Reproductive:No pathologic findings. Other: No ascites or pneumoperitoneum. Musculoskeletal: No acute abnormalities. IMPRESSION: Persisting enteroenteric intussusception in the left upper quadrant, although nonobstructive with oral contrast reaching the transverse colon. Left pelvic kidney. Electronically Signed   By: Ronnette Coke M.D.   On: 04/20/2024 04:31   CT ABDOMEN PELVIS W CONTRAST Result Date: 04/19/2024 CLINICAL DATA:  RLQ abdominal pain EXAM: CT ABDOMEN AND PELVIS WITH CONTRAST TECHNIQUE: Multidetector CT imaging of the abdomen and pelvis was performed using the standard protocol following bolus administration of intravenous contrast. RADIATION DOSE  REDUCTION: This exam was performed according to the departmental dose-optimization program which includes automated exposure control, adjustment of the mA and/or kV according to patient size and/or use of iterative reconstruction technique. CONTRAST:   75mL OMNIPAQUE IOHEXOL 350 MG/ML SOLN COMPARISON:  None Available. FINDINGS: Lower chest: No acute abnormality. Hepatobiliary: The hepatic parenchyma is mildly diffusely hypodense compared to the splenic parenchyma consistent with fatty infiltration. No focal liver abnormality. No gallstones, gallbladder wall thickening, or pericholecystic fluid. No biliary dilatation. Pancreas: No focal lesion. Normal pancreatic contour. No surrounding inflammatory changes. No main pancreatic ductal dilatation. Spleen: Normal in size without focal abnormality. Adrenals/Urinary Tract: No adrenal nodule bilaterally. Malrotated pelvic left kidney. Bilateral kidneys enhance symmetrically. No hydronephrosis. No hydroureter. The urinary bladder is unremarkable. Stomach/Bowel: Stomach is within normal limits. Left mid abdomen small small bowel intussusception. No evidence of bowel wall thickening or dilatation. Appendix appears normal. Vascular/Lymphatic: No abdominal aorta or iliac aneurysm. No abdominal, pelvic, or inguinal lymphadenopathy. Reproductive: Prostate is unremarkable. Other: No intraperitoneal free fluid. No intraperitoneal free gas. No organized fluid collection. Musculoskeletal: No abdominal wall hernia or abnormality. No suspicious lytic or blastic osseous lesions. No acute displaced fracture. Multilevel degenerative changes of the spine. IMPRESSION: 1. No acute intra-abdominal or intrapelvic abnormality. 2. Incidentally noted left mid abdomen small small bowel intussusception. 3. Incidentally noted malrotated pelvic left kidney. Electronically Signed   By: Morgane  Naveau M.D.   On: 04/19/2024 20:27    Anti-infectives: Anti-infectives (From admission, onward)    None       Assessment/Plan 23 y/o M w/ a long segment intussusception  - We discuss the pathophysiology and potential etiology of the intussusception. Given the length of the segment, I feel that it would be best to proceed to the OR for planned  diagnostic laparoscopy and SBR. We discussed the alternatives and potential risks of surgery, including but not limited to: bleeding, infection, damage to bowel or surrounding structures, failure to identify the segment, failure to obtain a diagnosis, leak, stricture, and need for additional procedures. All questions were addressed and consent was obtained.   - Timing of OR pending emergencies. He appears comfortable and without signs of complete obstruction and/or perforation. - NPO for now   LOS: 0 days   [facility] 04/20/2024, 8:10 AM Please see Amion for pager number during day hours 7:00am-4:30pm or 7:00am -11:30am on weekends

## 2024-04-21 DIAGNOSIS — K561 Intussusception: Secondary | ICD-10-CM | POA: Diagnosis not present

## 2024-04-21 LAB — BASIC METABOLIC PANEL WITH GFR
Anion gap: 11 (ref 5–15)
BUN: 11 mg/dL (ref 6–20)
CO2: 21 mmol/L — ABNORMAL LOW (ref 22–32)
Calcium: 8.8 mg/dL — ABNORMAL LOW (ref 8.9–10.3)
Chloride: 104 mmol/L (ref 98–111)
Creatinine, Ser: 1.03 mg/dL (ref 0.61–1.24)
GFR, Estimated: >60 mL/min (ref >60)
Glucose, Bld: 121 mg/dL — ABNORMAL HIGH (ref 70–99)
Potassium: 4 mmol/L (ref 3.5–5.1)
Sodium: 136 mmol/L (ref 135–145)

## 2024-04-21 LAB — CBC
HCT: 44.7 % (ref 39.0–52.0)
Hemoglobin: 15.5 g/dL (ref 13.0–17.0)
MCH: 30 pg (ref 26.0–34.0)
MCHC: 34.7 g/dL (ref 30.0–36.0)
MCV: 86.6 fL (ref 80.0–100.0)
Platelets: 243 10*3/uL (ref 150–400)
RBC: 5.16 MIL/uL (ref 4.22–5.81)
RDW: 11.7 % (ref 11.5–15.5)
WBC: 10 10*3/uL (ref 4.0–10.5)
nRBC: 0 % (ref 0.0–0.2)

## 2024-04-21 MED ORDER — OXYCODONE HCL 5 MG PO TABS: 2.5000 mg | ORAL_TABLET | Freq: Four times a day (QID) | ORAL | 0 refills | Status: DC | PRN

## 2024-04-21 NOTE — Discharge Summary (Signed)
 Calvin Fuller:[id number] DOB: 2001/01/03 DOA: 04/19/2024  PCP: Pcp, No  Admit date: 04/19/2024  Discharge date: 04/21/2024  Admitted From: Home   disposition: Home   Recommendations for Outpatient Follow-up:   Follow up with PCP as needed  Home Health: N/A Equipment/Devices: N/A Consultations: General surgery Discharge Condition: Proved CODE STATUS: Full Diet Recommendation: Regular     Chief Complaint  Patient presents with   Abdominal Pain     Brief history of present illness from the day of admission and additional interim summary    23 y.o. male with hx of psoriasis who presented due to acute onset of abdominal pain.  Reports that over the past few weeks he has had incomplete emptying of stool with constipation.  Denies small hard stools.  He has also had small amount of bright red blood on the toilet paper but no large-volume bleeding into the toilet bowl.  No melena.  Painless stools.  Yesterday awoke with severe abdominal pain describing sharp stabbing pain, non-ill localized.  Gradually improving 5 out of 10 on interview.  No nausea or vomiting but did have decreased appetite the day prior.  No other recent illness                                                                     Hospital Course   Workup in the ED was concerning for persistent enteroenteric intussusception, LUQ as seen on CT of abdomen pelvis.  It was noted that it was nonobstructive since oral contrast was reaching the transverse colon.  Patient was seen by general surgery.  Advised exploratory laparoscopy and SBR.  He was taken to the OR the morning of admission and was found to have: " At the start of the case the proximal small bowel was mildly dilated.  In the proximal to mid small bowel there were several areas where  the serosa appeared to have been stretched.  There was no perforation or clear injury identified."  Patient tolerated procedure well.  The following morning he was feeling well with resolution of abdominal pain.  He was passing flatus and ambulating without any difficulty.  Patient was seen by general surgery who recommended that patient be discharged home on small dose of oxycodone to be used as needed.  Post exploratory laparotomy instructions included in the AVS.   Discharge diagnosis     Principal Problem:   Intussusception [facility])    Discharge instructions    Discharge Instructions     Diet - low sodium heart healthy   Complete by: As directed    Increase activity slowly   Complete by: As directed        Discharge Medications   Allergies as of 04/21/2024   No Known Allergies  Medication List     PAUSE taking these medications    risankizumab -rzaa 150 MG/ML pen Wait to take this until your doctor or other care provider tells you to start again. Commonly known as: SKYRIZI  150 mg subQ at Week 0 and Week 4   Zoryve  0.3 % Crea Wait to take this until your doctor or other care provider tells you to start again. Generic drug: Roflumilast  Apply 1 Application topically daily. qd to aa psoriasis on body       STOP taking these medications    clobetasol  0.05 % external solution Commonly known as: TEMOVATE    guaifenesin  400 MG Tabs tablet Commonly known as: HUMIBID E   ketoconazole  2 % shampoo Commonly known as: NIZORAL    minoxidil  2.5 MG tablet Commonly known as: LONITEN    promethazine -dextromethorphan 6.25-15 MG/5ML syrup Commonly known as: PROMETHAZINE -DM   triamcinolone  cream 0.1 % Commonly known as: KENALOG        TAKE these medications    oxyCODONE 5 MG immediate release tablet Commonly known as: Oxy IR/ROXICODONE Take 0.5-1 tablets (2.5-5 mg total) by mouth every 6 (six) hours as needed for severe pain (pain score 7-10).          Follow-up Information     Cannon Champion, MD. Call.   Specialty: General Surgery Why: Call to confirm appointment date/time in 3-4 weeks for post-operative follow up. Contact information: [address] [place] [address] [phone]                 Major procedures and Radiology Reports - PLEASE review detailed and final reports thoroughly  -     CT ABDOMEN PELVIS WO CONTRAST Result Date: 04/20/2024 CLINICAL DATA:  Intussusception suspected EXAM: CT ABDOMEN AND PELVIS WITHOUT CONTRAST TECHNIQUE: Multidetector CT imaging of the abdomen and pelvis was performed following the standard protocol without IV contrast. RADIATION DOSE REDUCTION: This exam was performed according to the departmental dose-optimization program which includes automated exposure control, adjustment of the mA and/or kV according to patient size and/or use of iterative reconstruction technique. COMPARISON:  CT from yesterday FINDINGS: Lower chest:  No contributory findings. Hepatobiliary: No focal liver abnormality.No evidence of biliary obstruction or stone. Pancreas: Unremarkable. Spleen: Unremarkable. Adrenals/Urinary Tract: Negative adrenals. No hydronephrosis or stone. Left pelvic kidney. Unremarkable bladder. Stomach/Bowel: Persisting enteroenteric intussusception in the left upper quadrant, telescoped segment measuring approximately 7 cm in length. No gross mass lesion or edema by noncontrast technique. No bowel obstruction however, enteric contrast has reached the transverse colon and there is no preferential dilatation of proximal loops. Vascular/Lymphatic: No acute vascular abnormality. No mass or adenopathy. Reproductive:No pathologic findings. Other: No ascites or pneumoperitoneum. Musculoskeletal: No acute abnormalities. IMPRESSION: Persisting enteroenteric intussusception in the left upper quadrant, although nonobstructive with oral contrast reaching the transverse colon. Left pelvic  kidney. Electronically Signed   By: Ronnette Coke M.D.   On: 04/20/2024 04:31   CT ABDOMEN PELVIS W CONTRAST Result Date: 04/19/2024 CLINICAL DATA:  RLQ abdominal pain EXAM: CT ABDOMEN AND PELVIS WITH CONTRAST TECHNIQUE: Multidetector CT imaging of the abdomen and pelvis was performed using the standard protocol following bolus administration of intravenous contrast. RADIATION DOSE REDUCTION: This exam was performed according to the departmental dose-optimization program which includes automated exposure control, adjustment of the mA and/or kV according to patient size and/or use of iterative reconstruction technique. CONTRAST:  75mL OMNIPAQUE IOHEXOL 350 MG/ML SOLN COMPARISON:  None Available. FINDINGS: Lower chest: No acute abnormality. Hepatobiliary: The hepatic parenchyma is  mildly diffusely hypodense compared to the splenic parenchyma consistent with fatty infiltration. No focal liver abnormality. No gallstones, gallbladder wall thickening, or pericholecystic fluid. No biliary dilatation. Pancreas: No focal lesion. Normal pancreatic contour. No surrounding inflammatory changes. No main pancreatic ductal dilatation. Spleen: Normal in size without focal abnormality. Adrenals/Urinary Tract: No adrenal nodule bilaterally. Malrotated pelvic left kidney. Bilateral kidneys enhance symmetrically. No hydronephrosis. No hydroureter. The urinary bladder is unremarkable. Stomach/Bowel: Stomach is within normal limits. Left mid abdomen small small bowel intussusception. No evidence of bowel wall thickening or dilatation. Appendix appears normal. Vascular/Lymphatic: No abdominal aorta or iliac aneurysm. No abdominal, pelvic, or inguinal lymphadenopathy. Reproductive: Prostate is unremarkable. Other: No intraperitoneal free fluid. No intraperitoneal free gas. No organized fluid collection. Musculoskeletal: No abdominal wall hernia or abnormality. No suspicious lytic or blastic osseous lesions. No acute displaced  fracture. Multilevel degenerative changes of the spine. IMPRESSION: 1. No acute intra-abdominal or intrapelvic abnormality. 2. Incidentally noted left mid abdomen small small bowel intussusception. 3. Incidentally noted malrotated pelvic left kidney. Electronically Signed   By: Morgane  Naveau M.D.   On: 04/19/2024 20:27    Micro Results    No results found for this or any previous visit (from the past 240 hours).  Today   Subjective    Tymere Depuy feels much improved since admission.  Feels ready to go home.  Denies chest pain, shortness of breath or abdominal pain.  Feels they can take care of themselves with the resources they have at home.  Objective   Blood pressure 130/69, pulse 63, temperature 98.4 F (36.9 C), temperature source Oral, resp. rate 16, height 5' 7.01" (1.702 m), weight 65.8 kg, SpO2 100%.   Intake/Output Summary (Last 24 hours) at 04/21/2024 1017 Last data filed at 04/20/2024 1600 Gross per 24 hour  Intake 800 ml  Output 410 ml  Net 390 ml    Exam General: Patient appears well and in good spirits sitting up in bed in no acute distress.  Eyes: sclera anicteric, conjuctiva mild injection bilaterally CVS: S1-S2, regular  Respiratory:  decreased air entry bilaterally secondary to decreased inspiratory effort, rales at bases  GI: NABS, soft, NT  LE: No edema.  Neuro: A/O x 3, Moving all extremities equally with normal strength, CN 3-12 intact, grossly nonfocal.  Psych: patient is logical and coherent, judgement and insight appear normal, mood and affect appropriate to situation.    Data Review   CBC w Diff:  Lab Results  Component Value Date   WBC 10.0 04/21/2024   HGB 15.5 04/21/2024   HCT 44.7 04/21/2024   PLT 243 04/21/2024   LYMPHOPCT 48 04/20/2024   MONOPCT 7 04/20/2024   EOSPCT 2 04/20/2024   BASOPCT 1 04/20/2024    CMP:  Lab Results  Component Value Date   NA 136 04/21/2024   NA 139 08/30/2023   K 4.0 04/21/2024   CL 104  04/21/2024   CO2 21 (L) 04/21/2024   BUN 11 04/21/2024   BUN 18 08/30/2023   CREATININE 1.03 04/21/2024   PROT 6.9 04/19/2024   PROT 6.8 08/30/2023   ALBUMIN 4.1 04/19/2024   ALBUMIN 4.6 08/30/2023   BILITOT 0.7 04/19/2024   BILITOT 0.6 08/30/2023   ALKPHOS 45 04/19/2024   AST 22 04/19/2024   ALT 16 04/19/2024  .   Total Time in preparing paper work, data evaluation and todays exam - 35 minutes  Magdalene School M.D on 04/21/2024 at 10:17 AM  [facility]

## 2024-04-21 NOTE — Progress Notes (Signed)
 Progress Note  1 Day Post-Op  Subjective: Pt reports some abdominal soreness and some gas like pressure in upper abdomen. He has been ambulating and is passing some flatus as well. Keeping regular diet down.   Objective: Vital signs in last 24 hours: Temp:  [97.6 F (36.4 C)-99.1 F (37.3 C)] 98.4 F (36.9 C) (05/25 0748) Pulse Rate:  [51-72] 63 (05/25 0748) Resp:  [16-22] 16 (05/25 0748) BP: (107-130)/(59-78) 130/69 (05/25 0748) SpO2:  [95 %-100 %] 100 % (05/25 0748) Weight:  [65.8 kg] 65.8 kg (05/24 1247) Last BM Date : 04/19/24  Intake/Output from previous day: 05/24 0701 - 05/25 0700 In: 800 [I.V.:800] Out: 410 [Urine:400; Blood:10] Intake/Output this shift: No intake/output data recorded.  PE: General: pleasant, WD, thin male who is laying in bed in NAD Heart: regular, rate, and rhythm.   Lungs:  Respiratory effort nonlabored Abd: soft, appropriately ttp, ND, incisions C/D/I Psych: A&Ox3 with an appropriate affect.    Lab Results:  Recent Labs    04/20/24 0814 04/21/24 0732  WBC 3.9* 10.0  HGB 15.5 15.5  HCT 45.0 44.7  PLT 202 243   BMET Recent Labs    04/20/24 0814 04/21/24 0732  NA 138 136  K 4.2 4.0  CL 104 104  CO2 25 21*  GLUCOSE 77 121*  BUN 13 11  CREATININE 1.14 1.03  CALCIUM 9.1 8.8*   PT/INR No results for input(s): "LABPROT", "INR" in the last 72 hours. CMP     Component Value Date/Time   NA 136 04/21/2024 0732   NA 139 08/30/2023 1643   K 4.0 04/21/2024 0732   CL 104 04/21/2024 0732   CO2 21 (L) 04/21/2024 0732   GLUCOSE 121 (H) 04/21/2024 0732   BUN 11 04/21/2024 0732   BUN 18 08/30/2023 1643   CREATININE 1.03 04/21/2024 0732   CALCIUM 8.8 (L) 04/21/2024 0732   PROT 6.9 04/19/2024 1432   PROT 6.8 08/30/2023 1643   ALBUMIN 4.1 04/19/2024 1432   ALBUMIN 4.6 08/30/2023 1643   AST 22 04/19/2024 1432   ALT 16 04/19/2024 1432   ALKPHOS 45 04/19/2024 1432   BILITOT 0.7 04/19/2024 1432   BILITOT 0.6 08/30/2023 1643    GFRNONAA >60 04/21/2024 0732   Lipase     Component Value Date/Time   LIPASE 31 04/19/2024 1432       Studies/Results: CT ABDOMEN PELVIS WO CONTRAST Result Date: 04/20/2024 CLINICAL DATA:  Intussusception suspected EXAM: CT ABDOMEN AND PELVIS WITHOUT CONTRAST TECHNIQUE: Multidetector CT imaging of the abdomen and pelvis was performed following the standard protocol without IV contrast. RADIATION DOSE REDUCTION: This exam was performed according to the departmental dose-optimization program which includes automated exposure control, adjustment of the mA and/or kV according to patient size and/or use of iterative reconstruction technique. COMPARISON:  CT from yesterday FINDINGS: Lower chest:  No contributory findings. Hepatobiliary: No focal liver abnormality.No evidence of biliary obstruction or stone. Pancreas: Unremarkable. Spleen: Unremarkable. Adrenals/Urinary Tract: Negative adrenals. No hydronephrosis or stone. Left pelvic kidney. Unremarkable bladder. Stomach/Bowel: Persisting enteroenteric intussusception in the left upper quadrant, telescoped segment measuring approximately 7 cm in length. No gross mass lesion or edema by noncontrast technique. No bowel obstruction however, enteric contrast has reached the transverse colon and there is no preferential dilatation of proximal loops. Vascular/Lymphatic: No acute vascular abnormality. No mass or adenopathy. Reproductive:No pathologic findings. Other: No ascites or pneumoperitoneum. Musculoskeletal: No acute abnormalities. IMPRESSION: Persisting enteroenteric intussusception in the left upper quadrant, although nonobstructive with oral  contrast reaching the transverse colon. Left pelvic kidney. Electronically Signed   By: Ronnette Coke M.D.   On: 04/20/2024 04:31   CT ABDOMEN PELVIS W CONTRAST Result Date: 04/19/2024 CLINICAL DATA:  RLQ abdominal pain EXAM: CT ABDOMEN AND PELVIS WITH CONTRAST TECHNIQUE: Multidetector CT imaging of the abdomen  and pelvis was performed using the standard protocol following bolus administration of intravenous contrast. RADIATION DOSE REDUCTION: This exam was performed according to the departmental dose-optimization program which includes automated exposure control, adjustment of the mA and/or kV according to patient size and/or use of iterative reconstruction technique. CONTRAST:  75mL OMNIPAQUE IOHEXOL 350 MG/ML SOLN COMPARISON:  None Available. FINDINGS: Lower chest: No acute abnormality. Hepatobiliary: The hepatic parenchyma is mildly diffusely hypodense compared to the splenic parenchyma consistent with fatty infiltration. No focal liver abnormality. No gallstones, gallbladder wall thickening, or pericholecystic fluid. No biliary dilatation. Pancreas: No focal lesion. Normal pancreatic contour. No surrounding inflammatory changes. No main pancreatic ductal dilatation. Spleen: Normal in size without focal abnormality. Adrenals/Urinary Tract: No adrenal nodule bilaterally. Malrotated pelvic left kidney. Bilateral kidneys enhance symmetrically. No hydronephrosis. No hydroureter. The urinary bladder is unremarkable. Stomach/Bowel: Stomach is within normal limits. Left mid abdomen small small bowel intussusception. No evidence of bowel wall thickening or dilatation. Appendix appears normal. Vascular/Lymphatic: No abdominal aorta or iliac aneurysm. No abdominal, pelvic, or inguinal lymphadenopathy. Reproductive: Prostate is unremarkable. Other: No intraperitoneal free fluid. No intraperitoneal free gas. No organized fluid collection. Musculoskeletal: No abdominal wall hernia or abnormality. No suspicious lytic or blastic osseous lesions. No acute displaced fracture. Multilevel degenerative changes of the spine. IMPRESSION: 1. No acute intra-abdominal or intrapelvic abnormality. 2. Incidentally noted left mid abdomen small small bowel intussusception. 3. Incidentally noted malrotated pelvic left kidney. Electronically Signed    By: Morgane  Naveau M.D.   On: 04/19/2024 20:27    Anti-infectives: Anti-infectives (From admission, onward)    Start     Dose/Rate Route Frequency Ordered Stop   04/20/24 1300  ceFAZolin (ANCEF) IVPB 2g/100 mL premix        2 g 200 mL/hr over 30 Minutes Intravenous On call to O.R. 04/20/24 1246 04/20/24 1412        Assessment/Plan Enteric intussusception   POD1 s/p diagnostic laparoscopy  - tolerating reg diet - pain overall well controlled - stable for DC from surgery perspective, follow up and instruction in AVS  FEN: reg diet  VTE: SCDs ID: [email or web address]   LOS: 1 day     Annetta Killian, [facility] 04/21/2024, 9:24 AM Please see Amion for pager number during day hours 7:00am-4:30pm

## 2024-04-21 NOTE — Plan of Care (Signed)

## 2024-04-21 NOTE — Discharge Instructions (Signed)

## 2024-04-22 NOTE — Anesthesia Postprocedure Evaluation (Signed)
 Anesthesia Post Note  Patient: Calvin Fuller  Procedure(s) Performed: LAPAROSCOPY, DIAGNOSTIC     Patient location during evaluation: PACU Anesthesia Type: General Level of consciousness: awake and alert Pain management: pain level controlled Vital Signs Assessment: post-procedure vital signs reviewed and stable Respiratory status: spontaneous breathing, nonlabored ventilation and respiratory function stable Cardiovascular status: blood pressure returned to baseline and stable Postop Assessment: no apparent nausea or vomiting Anesthetic complications: no   There were no known notable events for this encounter.                Breah Joa

## 2024-04-23 ENCOUNTER — Encounter (HOSPITAL_COMMUNITY): Payer: Self-pay | Admitting: General Surgery

## 2024-05-01 ENCOUNTER — Ambulatory Visit (INDEPENDENT_AMBULATORY_CARE_PROVIDER_SITE_OTHER): Admitting: Internal Medicine

## 2024-05-01 ENCOUNTER — Encounter: Payer: Self-pay | Admitting: Internal Medicine

## 2024-05-01 VITALS — BP 116/78 | HR 68 | Ht 67.01 in | Wt 148.2 lb

## 2024-05-01 DIAGNOSIS — K5900 Constipation, unspecified: Secondary | ICD-10-CM

## 2024-05-01 DIAGNOSIS — K561 Intussusception: Secondary | ICD-10-CM | POA: Diagnosis not present

## 2024-05-01 NOTE — Patient Instructions (Signed)
 Drink 8 cups of water a day and walk 30 minutes a day.  Please purchase the following medications over the counter and take as directed: Fiber supplement such as Benefiber- use as directed daily Miralax: Take as half a dose to achieve regular bowel movements   Follow up in 6 months  _______________________________________________________  If your blood pressure at your visit was 140/90 or greater, please contact your primary care physician to follow up on this.  _______________________________________________________  If you are age 62 or older, your body mass index should be between 23-30. Your Body mass index is 23.21 kg/m. If this is out of the aforementioned range listed, please consider follow up with your Primary Care Provider.  If you are age 54 or younger, your body mass index should be between 19-25. Your Body mass index is 23.21 kg/m. If this is out of the aformentioned range listed, please consider follow up with your Primary Care Provider.   ________________________________________________________  The [facility] providers would like to encourage you to use MYCHART to communicate with providers for non-urgent requests or questions.  Due to long hold times on the telephone, sending your provider a message by [facility] may be a faster and more efficient way to get a response.  Please allow 48 business hours for a response.  Please remember that this is for non-urgent requests.  _______________________________________________________  .dor
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
## 2024-05-03 ENCOUNTER — Telehealth: Payer: Self-pay

## 2024-05-03 ENCOUNTER — Other Ambulatory Visit: Payer: Self-pay

## 2024-05-03 DIAGNOSIS — K561 Intussusception: Secondary | ICD-10-CM

## 2024-05-03 DIAGNOSIS — K5900 Constipation, unspecified: Secondary | ICD-10-CM

## 2024-05-03 NOTE — Telephone Encounter (Signed)
 No additional notes

## 2024-05-09 ENCOUNTER — Ambulatory Visit

## 2024-05-17 ENCOUNTER — Ambulatory Visit (HOSPITAL_COMMUNITY): Admission: RE | Admit: 2024-05-17 | Source: Ambulatory Visit

## 2024-05-23 ENCOUNTER — Other Ambulatory Visit: Payer: Self-pay | Admitting: Dermatology

## 2024-10-02 ENCOUNTER — Encounter: Payer: Self-pay | Admitting: Emergency Medicine

## 2024-10-02 ENCOUNTER — Ambulatory Visit: Admission: EM | Admit: 2024-10-02 | Discharge: 2024-10-02 | Disposition: A | Discharge status: Home / Self Care

## 2024-10-02 DIAGNOSIS — N342 Other urethritis: Secondary | ICD-10-CM | POA: Diagnosis not present

## 2024-10-02 DIAGNOSIS — Z113 Encounter for screening for infections with a predominantly sexual mode of transmission: Secondary | ICD-10-CM | POA: Diagnosis present

## 2024-10-02 HISTORY — DX: Psoriasis, unspecified: L40.9

## 2024-10-02 LAB — POCT URINE DIPSTICK
Bilirubin, UA: NEGATIVE
Blood, UA: NEGATIVE
Glucose, UA: NEGATIVE mg/dL
Ketones, POC UA: NEGATIVE mg/dL
Nitrite, UA: NEGATIVE
POC PROTEIN,UA: NEGATIVE
Spec Grav, UA: 1.02 (ref 1.010–1.025)
Urobilinogen, UA: 0.2 U/dL
pH, UA: 6 (ref 5.0–8.0)

## 2024-10-02 MED ORDER — DOXYCYCLINE HYCLATE 100 MG PO CAPS: 100.0000 mg | ORAL_CAPSULE | Freq: Two times a day (BID) | ORAL | 0 refills | Status: AC

## 2024-10-02 NOTE — Discharge Instructions (Addendum)
 You were tested for STIs today. You should receive your results in 3-5 days. If you have not received a call from the office or see results in your mychart please call the clinic where you were seen. It is best if your refrain from sexual activity until you get results back. If positive you will need to refrain from sexual activity for 2 weeks and be sure to complete any antibiotics prescribed in their entirety. '

## 2024-10-02 NOTE — ED Provider Notes (Signed)
[name]    CSN: [id number] Arrival date & time: 10/02/24  0913      History   Chief Complaint Chief Complaint  Patient presents with   Penile Discharge   Exposure to STD   Dysuria    HPI Calvin Fuller is a 23 y.o. male.   Patient presents today due to 4 days worth of penile discharge and dysuria.  Patient would like to be tested for STIs including blood work.  Patient states that he is only sexually active with women and has had no new partners recently, with current girlfriend for the past 6 months.  Patient states that he has had previous symptoms in the past, it has been due to STIs and UTIs in the past.  The history is provided by the patient.  Penile Discharge  Exposure to STD  Dysuria Presenting symptoms: dysuria and penile discharge     Past Medical History:  Diagnosis Date   Psoriasis     Patient Active Problem List   Diagnosis Date Noted   Intussusception (HCC) 04/19/2024   Proteinuria 02/26/2024   Lower abdominal pain 02/26/2024   Routine screening for STI (sexually transmitted infection) 02/26/2024   History of repair of ACL 12/14/2017   Mild concussion 08/14/2013    Past Surgical History:  Procedure Laterality Date   KNEE ARTHROSCOPY W/ ACL RECONSTRUCTION Left    LAPAROSCOPY N/A 04/20/2024   Procedure: LAPAROSCOPY, DIAGNOSTIC;  Surgeon: Polly Cordella DELENA, MD;  Location: [facility] OR;  Service: General;  Laterality: N/A;  POSSIBLE SMALL BOWEL RESECTION   WISDOM TOOTH EXTRACTION     all four removed       Home Medications    Prior to Admission medications   Medication Sig Start Date End Date Taking? Authorizing Provider  doxycycline  (VIBRAMYCIN ) 100 MG capsule Take 1 capsule (100 mg total) by mouth 2 (two) times daily for 7 days. 10/02/24 10/09/24 Yes Andra Corean BROCKS, PA-C    Family History Family History  Problem Relation Age of Onset   Colon cancer Neg Hx    Stomach cancer Neg Hx    Pancreatic cancer Neg Hx     Esophageal cancer Neg Hx     Social History Social History   Tobacco Use   Smoking status: Never    Passive exposure: Current   Smokeless tobacco: Never  Vaping Use   Vaping status: Every Day   Substances: Nicotine, Flavoring  Substance Use Topics   Alcohol use: Yes    Comment: social   Drug use: No     Allergies   Patient has no known allergies.   Review of Systems Review of Systems  Genitourinary:  Positive for dysuria and penile discharge.     Physical Exam Triage Vital Signs ED Triage Vitals [10/02/24 0957]  Encounter Vitals Group     BP 129/83     Girls Systolic BP Percentile      Girls Diastolic BP Percentile      Boys Systolic BP Percentile      Boys Diastolic BP Percentile      Pulse Rate 66     Resp 14     Temp 98.1 F (36.7 C)     Temp Source Oral     SpO2 97 %     Weight      Height      Head Circumference      Peak Flow      Pain Score 0     Pain Loc  Pain Education      Exclude from Growth Chart    No data found.  Updated Vital Signs BP 129/83 (BP Location: Left Arm)   Pulse 66   Temp 98.1 F (36.7 C) (Oral)   Resp 14   SpO2 97%   Visual Acuity Right Eye Distance:   Left Eye Distance:   Bilateral Distance:    Right Eye Near:   Left Eye Near:    Bilateral Near:     Physical Exam Vitals and nursing note reviewed.  Constitutional:      General: He is not in acute distress.    Appearance: Normal appearance. He is not ill-appearing, toxic-appearing or diaphoretic.  Eyes:     General: No scleral icterus. Cardiovascular:     Rate and Rhythm: Normal rate and regular rhythm.     Heart sounds: Normal heart sounds.  Pulmonary:     Effort: Pulmonary effort is normal. No respiratory distress.     Breath sounds: Normal breath sounds. No wheezing or rhonchi.  Abdominal:     General: Abdomen is flat. Bowel sounds are normal.     Palpations: Abdomen is soft.     Tenderness: There is no abdominal tenderness. There is no right CVA  tenderness or left CVA tenderness.  Skin:    General: Skin is warm.  Neurological:     Mental Status: He is alert and oriented to person, place, and time.  Psychiatric:        Mood and Affect: Mood normal.        Behavior: Behavior normal.      UC Treatments / Results  Labs (all labs ordered are listed, but only abnormal results are displayed) Labs Reviewed  POCT URINE DIPSTICK - Abnormal; Notable for the following components:      Result Value   Leukocytes, UA Trace (*)    All other components within normal limits  URINE CULTURE  HIV ANTIBODY (ROUTINE TESTING W REFLEX)  RPR  CYTOLOGY, (ORAL, ANAL, URETHRAL) ANCILLARY ONLY    EKG   Radiology No results found.  Procedures Procedures (including critical care time)  Medications Ordered in UC Medications - No data to display  Initial Impression / Assessment and Plan / UC Course  I have reviewed the triage vital signs and the nursing notes.  Pertinent labs & imaging results that were available during my care of the patient were reviewed by me and considered in my medical decision making (see chart for details).      Final Clinical Impressions(s) / UC Diagnoses   Final diagnoses:  Routine screening for STI (sexually transmitted infection)  Urethritis     Discharge Instructions      You were tested for STIs today. You should receive your results in 3-5 days. If you have not received a call from the office or see results in your mychart please call the clinic where you were seen. It is best if your refrain from sexual activity until you get results back. If positive you will need to refrain from sexual activity for 2 weeks and be sure to complete any antibiotics prescribed in their entirety.       ED Prescriptions     Medication Sig Dispense Auth. Provider   doxycycline  (VIBRAMYCIN ) 100 MG capsule Take 1 capsule (100 mg total) by mouth 2 (two) times daily for 7 days. 14 capsule Andra Corean BROCKS, PA-C       PDMP not reviewed this encounter.   Andra Corean BROCKS, PA-C  10/02/24 1035  

## 2024-10-02 NOTE — ED Triage Notes (Signed)
 Pt reports penile discharge x4 days. No known exposures to STDs. Describes clear discharge with moderate amounts of mucus. Mild dysuria over this same period. Hx of UTIs. Taking cranberry juice over the last 4 days with some relief. No new partners, only male partners. STD testing requested - cytology and blood work.

## 2024-10-03 LAB — URINE CULTURE: Culture: NO GROWTH

## 2024-10-03 LAB — CYTOLOGY, (ORAL, ANAL, URETHRAL) ANCILLARY ONLY
Chlamydia: POSITIVE — AB
Comment: NEGATIVE
Comment: NEGATIVE
Comment: NORMAL
Neisseria Gonorrhea: NEGATIVE
Trichomonas: NEGATIVE

## 2024-10-03 LAB — RPR: RPR Ser Ql: NONREACTIVE

## 2024-10-03 LAB — HIV ANTIBODY (ROUTINE TESTING W REFLEX): HIV Screen 4th Generation wRfx: NONREACTIVE

## 2024-10-04 ENCOUNTER — Ambulatory Visit (HOSPITAL_COMMUNITY): Payer: Self-pay

## 2024-11-24 ENCOUNTER — Telehealth

## 2024-12-15 ENCOUNTER — Ambulatory Visit (HOSPITAL_COMMUNITY)
